# Patient Record
Sex: Male | Born: 1945 | Race: White | Hispanic: No | Marital: Married | Smoking: Never smoker
Health system: Southern US, Community
[De-identification: ages and names within clinical notes are randomized; demographics above are authoritative.]

## PROBLEM LIST (undated history)

## (undated) DIAGNOSIS — N400 Enlarged prostate without lower urinary tract symptoms: Secondary | ICD-10-CM

## (undated) DIAGNOSIS — C4492 Squamous cell carcinoma of skin, unspecified: Secondary | ICD-10-CM

## (undated) DIAGNOSIS — K5790 Diverticulosis of intestine, part unspecified, without perforation or abscess without bleeding: Secondary | ICD-10-CM

## (undated) DIAGNOSIS — D1803 Hemangioma of intra-abdominal structures: Secondary | ICD-10-CM

## (undated) DIAGNOSIS — C439 Malignant melanoma of skin, unspecified: Secondary | ICD-10-CM

## (undated) DIAGNOSIS — I2510 Atherosclerotic heart disease of native coronary artery without angina pectoris: Secondary | ICD-10-CM

## (undated) DIAGNOSIS — E785 Hyperlipidemia, unspecified: Secondary | ICD-10-CM

## (undated) DIAGNOSIS — K5090 Crohn's disease, unspecified, without complications: Secondary | ICD-10-CM

## (undated) DIAGNOSIS — A0472 Enterocolitis due to Clostridium difficile, not specified as recurrent: Secondary | ICD-10-CM

## (undated) DIAGNOSIS — D649 Anemia, unspecified: Secondary | ICD-10-CM

## (undated) DIAGNOSIS — I471 Supraventricular tachycardia: Secondary | ICD-10-CM

## (undated) DIAGNOSIS — I4719 Other supraventricular tachycardia: Secondary | ICD-10-CM

## (undated) DIAGNOSIS — K7689 Other specified diseases of liver: Secondary | ICD-10-CM

## (undated) DIAGNOSIS — M1990 Unspecified osteoarthritis, unspecified site: Secondary | ICD-10-CM

## (undated) DIAGNOSIS — R319 Hematuria, unspecified: Secondary | ICD-10-CM

## (undated) DIAGNOSIS — C4491 Basal cell carcinoma of skin, unspecified: Secondary | ICD-10-CM

## (undated) DIAGNOSIS — D696 Thrombocytopenia, unspecified: Secondary | ICD-10-CM

## (undated) HISTORY — DX: Unspecified osteoarthritis, unspecified site: M19.90

## (undated) HISTORY — PX: BACK SURGERY: SHX140

## (undated) HISTORY — DX: Hemangioma of intra-abdominal structures: D18.03

## (undated) HISTORY — PX: CHOLECYSTECTOMY: SHX55

## (undated) HISTORY — PX: SHOULDER ARTHROSCOPY: SHX128

## (undated) HISTORY — PX: CARDIAC CATHETERIZATION: SHX172

## (undated) HISTORY — DX: Benign prostatic hyperplasia without lower urinary tract symptoms: N40.0

## (undated) HISTORY — DX: Other specified diseases of liver: K76.89

## (undated) HISTORY — PX: TRANSURETHRAL RESECTION OF PROSTATE: SHX73

## (undated) HISTORY — DX: Diverticulosis of intestine, part unspecified, without perforation or abscess without bleeding: K57.90

## (undated) HISTORY — DX: Enterocolitis due to Clostridium difficile, not specified as recurrent: A04.72

## (undated) HISTORY — DX: Crohn's disease, unspecified, without complications: K50.90

## (undated) HISTORY — PX: ACHILLES TENDON SURGERY: SHX542

---
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
## 2002-02-27 ENCOUNTER — Encounter: Payer: Self-pay | Admitting: Neurosurgery

## 2002-02-27 ENCOUNTER — Encounter: Admission: RE | Admit: 2002-02-27 | Discharge: 2002-02-27 | Payer: Self-pay | Admitting: Neurosurgery

## 2002-03-20 ENCOUNTER — Encounter: Admission: RE | Admit: 2002-03-20 | Discharge: 2002-03-20 | Payer: Self-pay | Admitting: Neurosurgery

## 2002-03-20 ENCOUNTER — Encounter: Payer: Self-pay | Admitting: Neurosurgery

## 2002-11-10 ENCOUNTER — Encounter: Admission: RE | Admit: 2002-11-10 | Discharge: 2002-11-10 | Payer: Self-pay | Admitting: Neurosurgery

## 2002-11-10 ENCOUNTER — Encounter: Payer: Self-pay | Admitting: Neurosurgery

## 2002-11-24 ENCOUNTER — Encounter: Payer: Self-pay | Admitting: Neurosurgery

## 2002-11-24 ENCOUNTER — Encounter: Admission: RE | Admit: 2002-11-24 | Discharge: 2002-11-24 | Payer: Self-pay | Admitting: Neurosurgery

## 2002-12-16 ENCOUNTER — Encounter: Admission: RE | Admit: 2002-12-16 | Discharge: 2002-12-16 | Payer: Self-pay | Admitting: Neurosurgery

## 2002-12-16 ENCOUNTER — Encounter: Payer: Self-pay | Admitting: Neurosurgery

## 2006-08-24 ENCOUNTER — Ambulatory Visit (HOSPITAL_COMMUNITY): Admission: RE | Admit: 2006-08-24 | Discharge: 2006-08-25 | Payer: Self-pay | Admitting: Neurosurgery

## 2008-07-28 ENCOUNTER — Ambulatory Visit: Payer: Self-pay | Admitting: Internal Medicine

## 2008-08-11 ENCOUNTER — Ambulatory Visit: Payer: Self-pay

## 2008-08-11 ENCOUNTER — Encounter: Payer: Self-pay | Admitting: Internal Medicine

## 2008-08-26 ENCOUNTER — Ambulatory Visit (HOSPITAL_BASED_OUTPATIENT_CLINIC_OR_DEPARTMENT_OTHER): Admission: RE | Admit: 2008-08-26 | Discharge: 2008-08-26 | Payer: Self-pay | Admitting: Orthopedic Surgery

## 2008-09-02 ENCOUNTER — Ambulatory Visit: Payer: Self-pay | Admitting: Internal Medicine

## 2010-05-17 ENCOUNTER — Ambulatory Visit: Payer: Self-pay | Admitting: Cardiology

## 2011-01-31 NOTE — Op Note (Signed)
NAME:  Chase Barrett, Chase Barrett                 ACCOUNT NO.:  [id number]   MEDICAL RECORD NO.:  [id number]          PATIENT TYPE:  AMB   LOCATION:  [facility]                         FACILITY:  [facility]   PHYSICIAN:  Marlowe Kays, M.D.  DATE OF BIRTH:  December 25, 1945   DATE OF PROCEDURE:  08/26/2008  DATE OF DISCHARGE:                               OPERATIVE REPORT   PREOPERATIVE DIAGNOSES:  1. Chronic impingement syndrome with rotator cuff tendinopathy.  2. Labral degeneration.   POSTOPERATIVE DIAGNOSES:  1. Chronic impingement syndrome with rotator cuff tendinopathy.  2. Labral degeneration.   OPERATION:  1. Left shoulder arthroscopy with labral debridement.  2. Arthroscopic subacromial decompression.  3. Decompression with planing of the distal inferior clavicle.   SURGEON:  Marlowe Kays, M.D.   ASSISTANTDruscilla Brownie. Cherlynn June.   ANESTHESIA:  General.   PATHOLOGY AND INDICATIONS FOR PROCEDURE:  Because of painful right  shoulder I obtained an MRI of the shoulder demonstrating the  preoperative diagnosis.  The distal clavicle had impingement on the  rotator cuff.  The [facility] joint was narrow but it was not tender so I did  not feel that distal clavicle resection was warranted.   PROCEDURE IN DETAIL:  Under satisfactory general anesthesia, beach-chair  position on the sliding frame, right shoulder girdle was prepped with  DuraPrep and draped in sterile field.  Time-out performed and then the  anatomy of the shoulder was marked out including the [facility] joint, coracoid  process and placement for posterior and lateral portals.  I infiltrated  the two portal sites and subacromial space all with half percent  Marcaine with adrenaline and through a posterior soft spot portal  atraumatically entered the glenohumeral joint which basically was  unremarkable except for a fairly significant labral degeneration without  frank tearing.  On rotation of the arm the labral fibers could be seen  getting  entrapped between the humeral head and the glenoid at the level  of the biceps anchor.  Accordingly I advanced the scope between the  biceps tendon and subscapularis and using a switching stick made an  anterior incision.  Over the switching stick I placed a metal cannula  and then used a 4.2 shaver to enter the joint debriding down the labrum  with pre and post films being taken.  I then redirected the scope in the  subacromial space through the lateral portal with a little bit of  difficulty because of his significant downward curvature of the distal  acromion.  I was able to get into the subacromial space    DICTATION ENDS AT THIS POINT           ______________________________  Marlowe Kays, M.D.     JA/MEDQ  D:  08/26/2008  T:  08/26/2008  Job:  [id number]

## 2011-01-31 NOTE — Op Note (Signed)
NAME:  CAMDEN, KNOTEK NO.:  [id number]   MEDICAL RECORD NO.:  [id number]          PATIENT TYPE:  AMB   LOCATION:  [facility]                         FACILITY:  [facility]   PHYSICIAN:  Marlowe Kays, M.D.  DATE OF BIRTH:  [birth year]   DATE OF PROCEDURE:  DATE OF DISCHARGE:                               OPERATIVE REPORT   We had a power outage at the hospital and I guess that terminated the  dictation.   ADDENDUM   Through the lateral portal, with some difficulty because of the  significant curving down of the lateral acromion, I was able to place a  metal cannula followed by the 4.2 shaver.  The bursal tissue there I was  able to remove with the shaver and then followed this with the 90 degree  ArthroCare vaporizer removing soft tissue from the underneath the  surface of the acromion going back and identifying the [facility] joint and the  distal clavicle.  I then followed this with the 4-mm oval bur and began  burring down the distal clavicle and the underneath surface of the  distal acromion and going back-and-forth between the bur and the  vaporizer I was able to remove soft tissue and all of the compressing  bone.  This was documented with pictures with his arm to his side and  the arm abducted.  His rotator cuff was roughened, but I did not see any  frank tearing from either side of the cuff.  We then removed all fluid  possible and we re-infiltrated the posterior and lateral portals,  infiltrated the anterior portal and we infiltrated the subacromial space  well with the half percent Marcaine with adrenaline.  All these wounds  were closed with 4-0 nylon, Betadine and Adaptic pressure dressings and  shoulder immobilizer.  He tolerated the procedure well and was taken to  the recovery room in satisfactory condition with no known complications.           ______________________________  Marlowe Kays, M.D.     JA/MEDQ  D:  08/26/2008  T:  08/26/2008  Job:  [id number]

## 2011-01-31 NOTE — Letter (Signed)
July 28, 2008    Selinda Flavin  [address] [place], [place]  [place], [place] [address]   RE:  Chase, Barrett  MRN:  [id number]  /  DOB:  02/23/1946   Dear Dr. Dimas Aguas:   It was my pleasure to see your patient, Chase Barrett in EP consultation  today for further evaluation of palpitations and chest pain.  As you  recall, Chase Barrett is a pleasant 65 year old gentleman without  significant medical problems.  He reports that over the past year, he  has had insidious onset of palpitations.  He describes irregular heart  beats  lasting 3-4 seconds.  These episodes occur 2-3 times per month  and are typically observed at night.  The patient has not identified any  triggers or precipitance for these episodes and knows that they  spontaneously upbeat.  He denies associated chest pain, shortness of  breath, or other symptoms.  He also denies dizziness, presyncope, or  syncope.  He reports that approximately a week ago, he did have one  episode of left chest and arm discomfort, which he describes as  approximately 5 seconds of mild left chest achingwith radiation into  his arm associated with palpitations.  He denies any other exertional  symptoms.  He remains quite active and exercises daily without  difficulties.  He is otherwise without complaint today.   PAST MEDICAL HISTORY:  1. Degenerative disk disease status post multiple prior back      surgeries.  2. 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  3. Status post cholecystectomy in 2004.  4. Benign prostatic hypertrophy status post TURP in 2002.   ALLERGIES:  No known drug allergies.   MEDICATIONS:  Aspirin 81 mg daily.   SOCIAL HISTORY:  The patient lives [place], [place] with his  spouse.  He has 4 grown healthy children.  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  He denies tobacco or drug use.  He drinks 2-3 cocktails every 2-3  evenings.   FAMILY HISTORY:  Notable for coronary artery disease.  He denies a  family  history of arrhythmia or sudden cardiac death.   REVIEW OF SYSTEMS:  All systems are reviewed and negative except as  outlined in the HPI above.   PHYSICAL EXAMINATION:  VITAL SIGNS:  Blood pressure 121/72, heart rate  54, respirations 18, weight 172 pounds.  GENERAL:  The patient is a thin, healthy-appearing male in no acute  distress.  He is alert and oriented x3.  HEENT:  Normocephalic, atraumatic.  Sclerae clear.  Conjunctivae pink.  Oropharynx clear.  NECK:  Supple.  No JVD, lymphadenopathy, or bruits.  LUNGS:  Clear to auscultation bilaterally.  HEART:  Regular rate and rhythm.  No murmurs, rubs, or gallops.  GI:  Soft, nontender, nondistended.  Positive bowel sounds.  EXTREMITIES:  No clubbing, cyanosis, or edema.  SKIN:  No ecchymosis or lacerations.  MUSCULOSKELETAL:  No deformity or atrophy.  PSYCH:  Euthymic mood, full affect.   EKG; sinus bradycardia at 54 beats per minute otherwise normal EKG.  The  PR interval was 148 milliseconds with a QT interval of 440 milliseconds.   IMPRESSION:  Chase Barrett is a very pleasant 65 year old gentleman with no  significant past medical history who now presents for EP consultation  regarding symptomatic palpitations and atypical chest pain.  The  patient's history is most consistent with premature atrial contractions  or premature ventricular contractions, though I cannot exclude other  arrhythmias without electrocardiographic  documentation.  I think that we  should obtain an echocardiogram to evaluate for structural heart  disease.  In the absence of structural heart disease, a life threatening  arrhythmia would be highly unlikely.  I will also obtain a graded  exercise test Myoview to evaluate for exertional arrhythmias or evidence  of ischemia.  As the patient's episodes are quite infrequent, I do not  think that an event monitor would be helpful at the present time that we  could consider this down the road.  I have not made any  medication  changes today.  I have asked Chase Barrett to follow up in my office in 6  weeks after his echo and stress test have been performed to further  discuss arrhythmia management.   Thank you for the opportunity of participating in the care of Chase Barrett.  Please feel free to contact me should any questions arise.    Sincerely,      Hillis Range, MD  Electronically Signed    JA/MedQ  DD: 07/28/2008  DT: 07/29/2008  Job #: [id number]

## 2011-01-31 NOTE — Letter (Signed)
September 02, 2008    Chase Chase Barrett  [address] [place], [place]  [place], [place] [address]   RE:  Chase, Chase Barrett  MRN:  [id number]  /  DOB:  [birth year]   Dear Dr. Dimas Aguas:   It was my pleasure to see your patient, Chase Chase Barrett in clinic today for  followup of his palpitations and chest discomfort.  As you recall, Mr.  Chase Barrett is a very pleasant 65 year old gentleman without significant  medical problems.  Upon last being evaluated in my clinic, he described  symptoms of irregular heart beats lasting several seconds as well as a  single episode of left arm discomfort and chest discomfort lasting only  several seconds.  Since that time, he has done quite well.  He denies  any significant palpitations.  He denies chest pain, shortness of  breath, orthopnea, PND, presyncope, or syncope.  He underwent shoulder  surgery several days ago without event.   PROBLEM LIST:  1. Palpitations likely due to premature atrial contractions or      premature ventricular contractions.  2. Degenerative joint disease.  3. Degenerative disk disease.  4. Benign prostatic hypertrophy.   ALLERGIES:  No known drug allergies.   MEDICATIONS:  Aspirin 81 mg daily.   PHYSICAL EXAMINATION:  VITAL SIGNS:  Blood pressure 128/78, heart rate  75, respirations 18, and weight 170 pounds.  GENERAL:  The patient is a thin male in no acute distress.  He is alert  and oriented x3.  HEENT:  Normocephalic, atraumatic.  Sclerae clear.  Conjunctivae pink.  Oropharynx clear.  NECK:  Supple.  No JVD, lymphadenopathy, or bruits.  LUNGS:  Clear to auscultation bilaterally.  HEART:  Regular rate and rhythm.  No murmurs, rubs, or gallops.  GI:  Soft, nontender, and nondistended.  Positive bowel sounds.  EXTREMITIES:  No clubbing, cyanosis, or edema.  NEUROLOGIC:  Nonfocal.  SKIN:  No ecchymosis or lacerations.   Transthoracic echocardiogram performed on August 11, 2008, revealed a  left ventricular ejection fraction of 55% with mild  mitral regurgitation  and mild left atrial dilatation.   GXT Myoview performed on August 11, 2008, revealed that the patient  exercised for a duration of 11 minutes and 16 seconds, reaching a heart  rate of 142 (90% of maximum heart rate) with upsloping ST depression  only.  He stopped study due to fatigue and did not have chest  discomfort.  He had a normal blood pressure response to exercise.  His  Cardiolite stress and rest images were normal with a left ventricular  ejection fraction of 52%.   IMPRESSION:  Chase Chase Barrett is a very pleasant 65 year old gentleman with  symptomatic palpitations, most likely due to premature atrial  contractions or premature ventricular contractions as well as a single  episode of atypical chest pain, who now presents for followup.  His GXT  Myoview reveals good exercise tolerance with low risk for ischemia.  His  echocardiogram reveals no significant structural heart disease.  I  therefore think that the patient's symptoms of palpitations are benign.  I do not think that further clinical evaluation is warranted at this  time.  I will therefore return the entirety of Chase Chase Barrett care to you.  I think that he should continue his regular exercise routine as well as  healthy lifestyle.  I would also continue aspirin 81 mg daily in long-  term.   PLAN:  1. Aspirin 81 mg daily.  2. Follow up in my office p.r.n.  Thank you for the opportunity of participating in the care of Chase Chase Barrett.  Please feel free to contact me if you would like to discuss his care  further.    Sincerely,      Hillis Range, MD  Electronically Signed    JA/MedQ  DD: 09/02/2008  DT: 09/03/2008  Job #: [id number]

## 2011-02-03 NOTE — H&P (Signed)
NAME:  Chase Barrett, Chase Barrett NO.:  [id number]   MEDICAL RECORD NO.:  [id number]          PATIENT TYPE:  OIB   LOCATION:  3003                         FACILITY:  [facility]   PHYSICIAN:  Hilda Lias, M.D.   DATE OF BIRTH:  07-Feb-1946   DATE OF ADMISSION:  08/24/2006  DATE OF DISCHARGE:  08/25/2006                              HISTORY & PHYSICAL   HISTORY OF PRESENT ILLNESS:  [name] is a gentleman who came to see me  complaining of back pain with radiation down to the left leg, associated  with discomfort and pain.  The patient denies any pain whatsoever in the  right leg.  This gentleman many years ago underwent degenerative disk  disease surgery with L4-L5 and L5-S1 foraminotomy and diskectomy.  I  have been following him on several occasions.  He had postoperative  treatment and now he is getting worse.  He had an MRI scan that shows  degenerative disk disease at the level of 4-5, 5-1; but there is a  fragment below 3-4 on the left side.   At the beginning, we were planning to go ahead with fusion, but now with  his pain being most localized compromising the L3 and L4 nerve roots, we  are going to proceed with a diskectomy at this level and taking care of  the chronic back pain later on.   PAST MEDICAL HISTORY:  Lumbar diskectomy x two.  He does not smoke.  He  drinks socially.  His family history is unremarkable.   REVIEW OF SYSTEMS:  Review of systems is positive for decreased hearing  and back pain.   PHYSICAL EXAMINATION:  Head, Eyes, Ears, Nose & Throat:  Normal.  Neck:  Normal.  Lungs:  Clear.  Heart:  Heart sounds are normal.  No murmur.  Extremities:  Normal pulses.  Neurologic:  Mental status is normal.  He  has weakness of the left quadriceps.  He has a burning sensation  in the  left L3-L4 dermatome in the right side.  He has a decrease of the  flexibility of the lumbar spine.   RADIOLOGIC FINDINGS:  The MRI scan shows degenerative disk disease at  L4-  L5 and L5-S1, but he has a free fragment at the level of L3-L4.  The  left L3 nerve root is affected.   CLINICAL IMPRESSION:  1. Left L3-L4 herniated disk with a fragment.  2. Degenerative disk disease, L4, L5, S1.   RECOMMENDATIONS:  The patient is being admitted for surgery.  The  procedure will be an L3-L4 diskectomy.  He knows about the risks,  especially CSF leak,  no improvement whatsoever, no improvement of the  lower back pain, need for further surgery, damage to vessels in the  abdomen, and paralysis.           ______________________________  Hilda Lias, M.D.     EB/MEDQ  D:  08/24/2006  T:  08/25/2006  Job:  [id number]

## 2011-02-03 NOTE — Op Note (Signed)
NAME:  Chase Barrett, Chase Barrett                 ACCOUNT NO.:  [id number]   MEDICAL RECORD NO.:  [id number]          PATIENT TYPE:  AMB   LOCATION:  SDS                          FACILITY:  [facility]   PHYSICIAN:  Hilda Lias, M.D.   DATE OF BIRTH:  05-Nov-1945   DATE OF PROCEDURE:  08/24/2006  DATE OF DISCHARGE:                               OPERATIVE REPORT   PREOPERATIVE DIAGNOSES:  Left L3-4 herniated disk with a fragment into  the body of L4; degenerative disk disease, 4-5 and 5-1; status post  lumbar diskectomy.   POSTOPERATIVE DIAGNOSES:  Left L3-4 herniated disk with a fragment into  the body of L4; degenerative disk disease, 4-5 and 5-1; status post  lumbar diskectomy.   PROCEDURES:  Left L3-L4 diskectomy, decompression of the L3-L4 nerve  roots, removal of calcified disk, removal of loose facet of L3;  microscopy.   SURGEON:  Hilda Lias, M.D.   CLINICAL HISTORY:  Mr. Kassis is a gentleman, who had 2 back surgeries in  the past.  I have been from him from the mid-80s.  Lately, he had been  complaining of back pain with radiation to the left leg.  Although he  has back pain, most of the pain was in the left leg.  He has failed  conservative treatment, including epidural injections and medications.  X-ray and MRI showed that he has degenerative disk disease at the levels  of 4-5 and 5-1, but there was a fragment at the level of 3-4 on the  left, which went along with the diagnosis.  Surgery was advised.  The  patient knows that in the future he might require lumbar fusion.  As a  matter of fact, that was the plan before we found the fragment at the  level of 3-4 on the left side.   DESCRIPTION OF PROCEDURES:  The patient was taken to the OR, and he was  positioned in a prone manner.  The back was cleaned with DuraPrep.  X-  rays showed that we were at the level of L3.  From then on, we went and  opened the incision in the skin from 3 to 4 on the left side.  Muscles  were retracted  laterally.  With the microscope, we drilled the lower  lamina of L3 and the upper of L4.  A thick calcified ligament was also  removed.  Immediately what we found was that the inferior facet of L3  was loose.  Because of that, there was no way to preserve it, so we went  ahead and removed it.  Then, with the 2 and 3-mm Kerrison punches, we  did the foraminotomy.  We retracted the thecal sac, and, indeed, there  was a large calcified disk in the midline, affecting not only the L3,  but also the L4 nerve roots.  Incision was made.  Fragments were removed  from the body of L4.  Then, we entered the disk space at 3-4 and total  diskectomy was achieved with the fragments and removed laterally.  At  the end, we had a good decompression.  From then on, we went to the  lateral aspect of the facet joint, and using the same facet bone, we  drilled the area and we put some bone for autograft localized fusion.  The area was irrigated.  Fentanyl was left in the epidural space and the  wound was closed with Vicryl and Steri-Strips.           ______________________________  Hilda Lias, M.D.     EB/MEDQ  D:  08/24/2006  T:  08/25/2006  Job:  [id number]

## 2011-06-23 LAB — POCT HEMOGLOBIN-HEMACUE: Hemoglobin: 15.3 g/dL (ref 13.0–17.0)

## 2011-07-05 ENCOUNTER — Encounter: Payer: Self-pay | Admitting: Internal Medicine

## 2011-07-28 ENCOUNTER — Encounter: Payer: Self-pay | Admitting: Internal Medicine

## 2011-08-01 ENCOUNTER — Encounter: Payer: Self-pay | Admitting: Internal Medicine

## 2011-08-01 ENCOUNTER — Ambulatory Visit (INDEPENDENT_AMBULATORY_CARE_PROVIDER_SITE_OTHER): Payer: Managed Care, Other (non HMO) | Admitting: Internal Medicine

## 2011-08-01 VITALS — BP 126/82 | HR 72 | Ht 70.0 in | Wt 170.0 lb

## 2011-08-01 DIAGNOSIS — R933 Abnormal findings on diagnostic imaging of other parts of digestive tract: Secondary | ICD-10-CM

## 2011-08-01 NOTE — Patient Instructions (Addendum)
CC: Dr Selinda Flavin, [facility], Patient, Dr Mena Goes

## 2011-08-01 NOTE — Progress Notes (Signed)
Chase Barrett 1946-03-04 MRN [id number]   History of Present Illness:  This is a 65 year old white male who here for evaluation of an abnormal CT scan of the abdomen and pelvis which was done for microscopic hematuria. The CT scan noted a segment of the small intestine which was thickened. There was no obstruction and there were no signs of Crohn's disease. Patient is asymptomatic. He has 3-4 bowel movements a day which are formed and there  has been no blood. He denies cramps, abdominal pain or any weight changes. There is no family history of inflammatory bowel disease. He takes Aleve 2-4 times a day for back problems. His last colonoscopy in July 2003 showed internal hemorrhoids and mild diverticulosis of the left colon. There is a history of cholecystectomy for cholelithiasis.   Past Medical History  Diagnosis Date  . BPH (benign prostatic hyperplasia)   . Arthritis   . Diverticulosis   . Hepatic cyst   . Hepatic hemangioma   . Hemorrhoids   . DJD (degenerative joint disease)    Past Surgical History  Procedure Date  . Back surgery     x5  . Cholecystectomy   . Shoulder arthroscopy   . Transurethral resection of prostate     reports that he has never smoked. He does not have any smokeless tobacco history on file. He reports that he drinks alcohol. He reports that he does not use illicit drugs. family history includes Lung cancer in his mother; Prostate cancer in his father; and Stroke in his sister. No Known Allergies      Review of Systems: Denies upper GI symptoms of heartburn dysphagia or shortness of breath or chest pain  The remainder of the 10 point ROS is negative except as outlined in H&P   Physical Exam: General appearance  Well developed, in no distress. Eyes- non icteric. HEENT nontraumatic, normocephalic. Mouth no lesions, tongue papillated, no cheilosis. Neck supple without adenopathy, thyroid not enlarged, no carotid bruits, no JVD. Lungs Clear to  auscultation bilaterally. Cor normal S1, normal S2, regular rhythm, no murmur,  quiet precordium. Abdomen: Soft abdomen with normoactive bowel sounds. No tenderness. No distention. No bruit. No fullness. Rectal: Normal perianal area. Normal rectal sphincter tone. Small amount of soft Hemoccult negative stool. Soft regular prostate. Extremities no pedal edema. Skin no lesions. Neurological alert and oriented x 3. Psychological normal mood and affect.  Assessment and Plan:  Problem #1 abnormal CT scan of the abdomen. There is no clinical correlation with the abnormality on the CT scan. The thickening of the small bowel is nonspecific and may reflect peristalsis. There are no symptoms which would suggest Crohn's disease, ischemic bowel or ileitis. Anti-inflammatory agents can cause ulcerations in the small bowel and he takes Aleve. I asked him to cut back on his Aleve at this time.  Problem #2 Colorectal screening. Patient's last colonoscopy was in July 2003. A recall colonoscopy will be due in July 2013 at which time he will receive a recall letter.   08/01/2011 Lina Sar

## 2011-12-29 ENCOUNTER — Encounter: Payer: Self-pay | Admitting: Internal Medicine

## 2012-01-24 DIAGNOSIS — C4492 Squamous cell carcinoma of skin, unspecified: Secondary | ICD-10-CM

## 2012-01-24 HISTORY — DX: Squamous cell carcinoma of skin, unspecified: C44.92

## 2012-08-21 ENCOUNTER — Encounter (HOSPITAL_COMMUNITY): Payer: Self-pay | Admitting: *Deleted

## 2012-08-21 ENCOUNTER — Emergency Department (HOSPITAL_COMMUNITY): Payer: Managed Care, Other (non HMO)

## 2012-08-21 ENCOUNTER — Inpatient Hospital Stay (HOSPITAL_COMMUNITY)
Admission: EM | Admit: 2012-08-21 | Discharge: 2012-08-24 | DRG: 247 | Disposition: A | Discharge status: Home / Self Care | Payer: Managed Care, Other (non HMO) | Source: Ambulatory Visit | Attending: Cardiovascular Disease | Admitting: Cardiovascular Disease

## 2012-08-21 DIAGNOSIS — M1990 Unspecified osteoarthritis, unspecified site: Secondary | ICD-10-CM | POA: Diagnosis present

## 2012-08-21 DIAGNOSIS — Z8249 Family history of ischemic heart disease and other diseases of the circulatory system: Secondary | ICD-10-CM

## 2012-08-21 DIAGNOSIS — K5730 Diverticulosis of large intestine without perforation or abscess without bleeding: Secondary | ICD-10-CM | POA: Diagnosis present

## 2012-08-21 DIAGNOSIS — I4891 Unspecified atrial fibrillation: Secondary | ICD-10-CM | POA: Diagnosis present

## 2012-08-21 DIAGNOSIS — D1803 Hemangioma of intra-abdominal structures: Secondary | ICD-10-CM | POA: Diagnosis present

## 2012-08-21 DIAGNOSIS — Z87438 Personal history of other diseases of male genital organs: Secondary | ICD-10-CM

## 2012-08-21 DIAGNOSIS — D696 Thrombocytopenia, unspecified: Secondary | ICD-10-CM

## 2012-08-21 DIAGNOSIS — I2510 Atherosclerotic heart disease of native coronary artery without angina pectoris: Principal | ICD-10-CM | POA: Diagnosis present

## 2012-08-21 DIAGNOSIS — N400 Enlarged prostate without lower urinary tract symptoms: Secondary | ICD-10-CM | POA: Diagnosis present

## 2012-08-21 DIAGNOSIS — Z23 Encounter for immunization: Secondary | ICD-10-CM

## 2012-08-21 DIAGNOSIS — Z9089 Acquired absence of other organs: Secondary | ICD-10-CM

## 2012-08-21 DIAGNOSIS — K7689 Other specified diseases of liver: Secondary | ICD-10-CM | POA: Diagnosis present

## 2012-08-21 DIAGNOSIS — Z79899 Other long term (current) drug therapy: Secondary | ICD-10-CM

## 2012-08-21 DIAGNOSIS — E785 Hyperlipidemia, unspecified: Secondary | ICD-10-CM | POA: Diagnosis present

## 2012-08-21 DIAGNOSIS — D649 Anemia, unspecified: Secondary | ICD-10-CM

## 2012-08-21 DIAGNOSIS — R079 Chest pain, unspecified: Secondary | ICD-10-CM

## 2012-08-21 DIAGNOSIS — I200 Unstable angina: Secondary | ICD-10-CM

## 2012-08-21 HISTORY — DX: Hematuria, unspecified: R31.9

## 2012-08-21 HISTORY — DX: Other supraventricular tachycardia: I47.19

## 2012-08-21 HISTORY — DX: Supraventricular tachycardia: I47.1

## 2012-08-21 HISTORY — DX: Atherosclerotic heart disease of native coronary artery without angina pectoris: I25.10

## 2012-08-21 HISTORY — DX: Hyperlipidemia, unspecified: E78.5

## 2012-08-21 HISTORY — DX: Thrombocytopenia, unspecified: D69.6

## 2012-08-21 HISTORY — DX: Anemia, unspecified: D64.9

## 2012-08-21 LAB — CBC
HCT: 36.2 % — ABNORMAL LOW (ref 39.0–52.0)
Hemoglobin: 12.1 g/dL — ABNORMAL LOW (ref 13.0–17.0)
MCH: 28.9 pg (ref 26.0–34.0)
RBC: 4.19 MIL/uL — ABNORMAL LOW (ref 4.22–5.81)

## 2012-08-21 LAB — COMPREHENSIVE METABOLIC PANEL
ALT: 16 U/L (ref 0–53)
Alkaline Phosphatase: 85 U/L (ref 39–117)
CO2: 29 mEq/L (ref 19–32)
GFR calc Af Amer: >90 mL/min (ref >90)
GFR calc non Af Amer: 89 mL/min — ABNORMAL LOW (ref >90)
Glucose, Bld: 88 mg/dL (ref 70–99)
Potassium: 4.6 mEq/L (ref 3.5–5.1)
Sodium: 141 mEq/L (ref 135–145)

## 2012-08-21 LAB — TROPONIN I
Troponin I: <0.3 ng/mL (ref <0.30)
Troponin I: <0.3 ng/mL (ref <0.30)

## 2012-08-21 LAB — APTT: aPTT: 30 seconds (ref 24–37)

## 2012-08-21 LAB — HEMOGLOBIN A1C
Hgb A1c MFr Bld: 5.3 % (ref <5.7)
Mean Plasma Glucose: 105 mg/dL (ref <117)

## 2012-08-21 MED ORDER — NITROGLYCERIN 0.4 MG SL SUBL
0.4000 mg | SUBLINGUAL_TABLET | SUBLINGUAL | Status: DC | PRN
  Administered 2012-08-21: 0.4 mg via SUBLINGUAL

## 2012-08-21 MED ORDER — ATORVASTATIN CALCIUM 40 MG PO TABS
40.0000 mg | ORAL_TABLET | Freq: Every day | ORAL | Status: DC
  Administered 2012-08-21 – 2012-08-23 (×3): 40 mg via ORAL
  Filled 2012-08-21 (×5): qty 1

## 2012-08-21 MED ORDER — SODIUM CHLORIDE 0.9 % IV SOLN: 250.0000 mL | INTRAVENOUS | Status: DC | PRN

## 2012-08-21 MED ORDER — ASPIRIN EC 81 MG PO TBEC
81.0000 mg | DELAYED_RELEASE_TABLET | Freq: Every day | ORAL | Status: DC
  Administered 2012-08-23: 81 mg via ORAL
  Filled 2012-08-21 (×2): qty 1

## 2012-08-21 MED ORDER — SODIUM CHLORIDE 0.9 % IJ SOLN: 3.0000 mL | INTRAMUSCULAR | Status: DC | PRN

## 2012-08-21 MED ORDER — ONDANSETRON HCL 4 MG/2ML IJ SOLN
4.0000 mg | Freq: Four times a day (QID) | INTRAMUSCULAR | Status: DC | PRN
  Filled 2012-08-21: qty 2

## 2012-08-21 MED ORDER — PNEUMOCOCCAL VAC POLYVALENT 25 MCG/0.5ML IJ INJ
0.5000 mL | INJECTION | INTRAMUSCULAR | Status: AC
  Filled 2012-08-21: qty 0.5

## 2012-08-21 MED ORDER — ALFUZOSIN HCL ER 10 MG PO TB24
10.0000 mg | ORAL_TABLET | Freq: Every day | ORAL | Status: DC
  Administered 2012-08-23 – 2012-08-24 (×2): 10 mg via ORAL
  Filled 2012-08-21 (×5): qty 1

## 2012-08-21 MED ORDER — ASPIRIN 81 MG PO CHEW
324.0000 mg | CHEWABLE_TABLET | ORAL | Status: AC
  Administered 2012-08-22: 324 mg via ORAL
  Filled 2012-08-21: qty 4

## 2012-08-21 MED ORDER — SODIUM CHLORIDE 0.9 % IV SOLN
INTRAVENOUS | Status: DC
  Administered 2012-08-21: 21:00:00 via INTRAVENOUS

## 2012-08-21 MED ORDER — ACETAMINOPHEN 325 MG PO TABS: 650.0000 mg | ORAL_TABLET | ORAL | Status: DC | PRN

## 2012-08-21 MED ORDER — INFLUENZA VIRUS VACC SPLIT PF IM SUSP
0.5000 mL | INTRAMUSCULAR | Status: AC
  Filled 2012-08-21: qty 0.5

## 2012-08-21 MED ORDER — ASPIRIN 81 MG PO CHEW: 243.0000 mg | CHEWABLE_TABLET | Freq: Once | ORAL | Status: DC

## 2012-08-21 MED ORDER — DIAZEPAM 5 MG PO TABS
5.0000 mg | ORAL_TABLET | ORAL | Status: AC
  Administered 2012-08-22: 5 mg via ORAL
  Filled 2012-08-21: qty 1

## 2012-08-21 MED ORDER — SODIUM CHLORIDE 0.9 % IJ SOLN
3.0000 mL | Freq: Two times a day (BID) | INTRAMUSCULAR | Status: DC
  Administered 2012-08-21: 3 mL via INTRAVENOUS

## 2012-08-21 MED ORDER — ALPRAZOLAM 0.25 MG PO TABS: 0.2500 mg | ORAL_TABLET | Freq: Two times a day (BID) | ORAL | Status: DC | PRN

## 2012-08-21 NOTE — ED Provider Notes (Signed)
History     CSN: [id number]  Arrival date & time 08/21/12  1024   First MD Initiated Contact with Patient 08/21/12 1033      Chief Complaint  Patient presents with  . Chest Pain    painfree currently    (Consider location/radiation/quality/duration/timing/severity/associated sxs/prior treatment) HPI Comments: Mr. Schuneman presents via EMS for evaluation of chest discomfort.  He states 1 week ago after swimming about 600 yards he experienced vague discomfort that resolved after resting.  2 days later after walking 1/4 mile up a steep hill he experienced similar discomfort that resolved after rest.  2 days ago wile swimming, this time less than half the original distance, he had similar pain that was more intense and lingered longer.  He has hadd several vague episodes  Also.  Today, after walking up the stairs at work, he had anther episode that resolved after resting bu again returned while just sitting.  A coworker noticed he wasn't feeling well and after he explained the story, EMS was called.  He received aspirin and ntg en route to the ER.  He reports being extremely active and denies alcohol, drug, or tobacco abuse.  Patient is a 66 y.o. male presenting with chest pain. The history is provided by the patient. No language interpreter was used.  Chest Pain The chest pain began 5 - 7 days ago. Chest pain occurs intermittently. The chest pain is worsening. The pain is associated with exertion. At its most intense, the pain is at 7/10. The pain is currently at 0/10. The quality of the pain is described as aching, dull and pressure-like. The pain radiates to the left neck. Pertinent negatives for primary symptoms include no fever, no fatigue, no syncope, no shortness of breath, no cough, no wheezing, no palpitations, no abdominal pain, no nausea, no vomiting, no dizziness and no altered mental status.  Pertinent negatives for associated symptoms include no claudication, no diaphoresis, no lower  extremity edema, no near-syncope, no numbness, no orthopnea and no weakness. He tried aspirin and nitroglycerin (asa and ntg given by EMS) for the symptoms. Risk factors include male gender.     Past Medical History  Diagnosis Date  . BPH (benign prostatic hyperplasia)   . Arthritis   . Diverticulosis   . Hepatic cyst   . Hepatic hemangioma   . Hemorrhoids   . DJD (degenerative joint disease)     Past Surgical History  Procedure Date  . Back surgery     x5  . Cholecystectomy   . Shoulder arthroscopy   . Transurethral resection of prostate     Family History  Problem Relation Age of Onset  . Lung cancer Mother   . Prostate cancer Father   . Stroke Sister     History  Substance Use Topics  . Smoking status: Never Smoker   . Smokeless tobacco: Not on file  . Alcohol Use: Yes     Comment: 2 cocktails 2-3 times per week      Review of Systems  Constitutional: Negative for fever, diaphoresis and fatigue.  Respiratory: Negative for cough, shortness of breath and wheezing.   Cardiovascular: Positive for chest pain. Negative for palpitations, orthopnea, claudication, syncope and near-syncope.  Gastrointestinal: Negative for nausea, vomiting and abdominal pain.  Neurological: Negative for dizziness, weakness and numbness.  Psychiatric/Behavioral: Negative for altered mental status.  All other systems reviewed and are negative.    Allergies  Review of patient's allergies indicates no known allergies.  Home Medications  Current Outpatient Rx  Name  Route  Sig  Dispense  Refill  . ALFUZOSIN HCL ER 10 MG PO TB24   Oral   Take 10 mg by mouth daily.           . ADULT MULTIVITAMIN W/MINERALS CH   Oral   Take 1 tablet by mouth daily.         Marland Kitchen NAPROXEN SODIUM 220 MG PO TABS   Oral   Take 440 mg by mouth daily as needed. For back pain         . TADALAFIL 20 MG PO TABS   Oral   Take 20 mg by mouth daily as needed.             BP 138/78  Pulse 59   Temp 97.1 F (36.2 C) (Oral)  Resp 16  SpO2 100%  Physical Exam  Nursing note and vitals reviewed. Constitutional: He is oriented to person, place, and time. He appears well-developed. No distress.  HENT:  Head: Normocephalic and atraumatic.  Right Ear: External ear normal.  Left Ear: External ear normal.  Nose: Nose normal.  Mouth/Throat: Oropharynx is clear and moist. No oropharyngeal exudate.  Eyes: Conjunctivae normal are normal. Pupils are equal, round, and reactive to light. Right eye exhibits no discharge. Left eye exhibits no discharge. No scleral icterus.  Neck: Normal range of motion. Neck supple. No JVD present. No tracheal deviation present.  Cardiovascular: Regular rhythm, S1 normal, S2 normal, normal heart sounds, intact distal pulses and normal pulses.   No extrasystoles are present. Bradycardia present.  PMI is not displaced.  Exam reveals no gallop, no distant heart sounds, no friction rub and no decreased pulses.   No murmur heard. Pulmonary/Chest: Breath sounds normal. No stridor. No respiratory distress. He has no wheezes. He has no rales. He exhibits no tenderness.  Abdominal: Soft. Bowel sounds are normal. He exhibits no distension and no mass. There is no tenderness. There is no rebound and no guarding.  Musculoskeletal: Normal range of motion. He exhibits no edema and no tenderness.  Lymphadenopathy:    He has no cervical adenopathy.  Neurological: He is alert and oriented to person, place, and time. No cranial nerve deficit.  Skin: Skin is warm and dry. No rash noted. He is not diaphoretic. No erythema. No pallor.  Psychiatric: He has a normal mood and affect. His behavior is normal.    ED Course  Procedures (including critical care time)   Labs Reviewed  CBC  COMPREHENSIVE METABOLIC PANEL  TROPONIN I  CK TOTAL AND CKMB  PROTIME-INR  APTT   No results found.   No diagnosis found.   Date: 08/21/2012  Rate: 50 bpm  Rhythm: sinus bradycardia  QRS  Axis: normal  Intervals: normal  ST/T Wave abnormalities: normal  Conduction Disutrbances:none  Narrative Interpretation:   Old EKG Reviewed: unchanged      MDM  Pt presents for evaluation of chest pain.  He is currently pain free, note stable VS, NAD.  He describes a hx of exertional discomfort over the last week that has increased in frequency, persistence, and diminished in the amount of exertion it requires to cause the pain.  Today he had pain at rest.  Note no evidence of acute ischemia on his EKG.  Will obtain basic labs, cardiac markers, and CXR.  Will consult the on-call cardiology group for evaluation as the results become available.  Secondary to a concerning history, I will recommend admission regardless of  the results of the labs.  1230.  Pt is pain free, NAD.  Note negative CXR, basic labs, and cardiac markers.  Contacted the on-call provide for the [facility].  He will be evaluated at the bedside.  Anticipate admission for further testing.      Tobin Chad, MD 08/21/12 1245

## 2012-08-21 NOTE — ED Notes (Signed)
Pt awaiting cardiology consult for admit status.

## 2012-08-21 NOTE — Progress Notes (Signed)
Walked into room, pt c/o 8/10, right sided stabbing pain, going thru to the back; pt also c/o of some nausea; pts VS 162/84, NSR; pt placed on 2L-[place], sats 100%; EKG obtained, no cahnges noted, 1 SL NTG given and pain is gone; Alinda Money, cards PA, paged and made aware; will continue to monitor

## 2012-08-21 NOTE — ED Notes (Signed)
Pt here per [facility] with c/o episodes of CP with activity.  CP usually resolves when pt rests.  Period of CP started last night that lasted longer than usual with associated shortness of breath.  CP resolved with rest.  Pt advises he had a period CP last night when getting up during the night.  Period of CP this am that he called 911 for.  [facility] placed IV and gave pt 1 NTG SL with relief of CP.  Pt states he also took a regular ASA while awaiting EMS.

## 2012-08-21 NOTE — H&P (Signed)
History and Physical   Patient ID: Chase Barrett MRN: [id number], DOB/AGE: 11-18-1945   Admit date: 08/21/2012 Date of Consult: 08/21/2012   Primary Physician: Selinda Flavin, MD Primary Cardiologist: New to cardiology   HPI: Chase Barrett is a 66yo male with no prior cardiac history, PMHx s/f DJD, OA and BPH (s/p TURP) and history of hematuria who presents to [facility] ED with progressively worsening exertional chest pain.   Underwent Abd/pelv CT in 06/2011 for hematuria revealed no acute etiology. Hemangioma detected. He reports a prior history of urosepsis sourced from prostatitis. He reports undergoing a nuclear stress test after recovering given elevated BP and HR during that time. This was normal. He underwent ETT 3 years ago for a routine employment physical which was normal.   He is fairly active and eats healthily. He swims, walks and bikes frequently. Last Wednesday, he reports going for a swim with a goal to complete 600 yds. Mid-way through, he developed dull, substernal chest tightness radiating to his left arm. He rested, the pain alleviated, and he completed the swim. A few days later, he noticed the same chest discomfort walking up an incline, relieving with rest and walking on flat ground and occurring once more up an incline. These episodes occurred at shorter activity levels and higher intensity, then eventually at rest around 2 AM and at work this morning. He reports associated shortness of breath, lightheadedness and diaphoresis. No active bleeding. No association with inspiration, palpation, meals or position changes. No unilateral leg pain or weakness. Does report occasional RLE swelling attributed to lymphedema improved with compression stockings/elevation. Clearly distinct from muscle soreness, arthritic pain. He informed his coworkers of his pain this morning and EMS was called. NTG SL x 1 was given with relief. He was given a full-dose ASA and transported to [facility] ED. No active  bleeding, fevers, chills or cough. No hx of HTN, HLD, DM, tobacco abuse. Father with CAD, no prior MIs, alive at 35.    In the ED, EKG is w/o ischemic changes. Cardiac biomarkers WNL x 1. CXR unremarkable. BMET unremarkable. CBC with mild normocytic anemia and mild thrombocytopenia.   Problem List: Past Medical History  Diagnosis Date  . BPH (benign prostatic hyperplasia)   . Arthritis   . Diverticulosis   . Hepatic cyst   . Hepatic hemangioma   . Hemorrhoids   . DJD (degenerative joint disease)     Past Surgical History  Procedure Date  . Back surgery     x5  . Cholecystectomy   . Shoulder arthroscopy   . Transurethral resection of prostate      Allergies: No Known Allergies  Home Medications: Prior to Admission medications   Medication Sig Start Date End Date Taking? Authorizing Provider  alfuzosin (UROXATRAL) 10 MG 24 hr tablet Take 10 mg by mouth daily.     Yes Historical Provider, MD  Multiple Vitamin (MULTIVITAMIN WITH MINERALS) TABS Take 1 tablet by mouth daily.   Yes Historical Provider, MD  naproxen sodium (ANAPROX) 220 MG tablet Take 440 mg by mouth daily as needed. For back pain   Yes Historical Provider, MD  tadalafil (CIALIS) 20 MG tablet Take 20 mg by mouth daily as needed.     Yes Historical Provider, MD    Inpatient Medications:     . [DISCONTINUED] aspirin  243 mg Oral Once    (Not in a hospital admission)  Family History  Problem Relation Age of Onset  . Lung cancer Mother   .  Prostate cancer Father   . Stroke Sister      History   Social History  . Marital Status: Married    Spouse Name: N/A    Number of Children: 4  . Years of Education: N/A   Occupational History  .  [employer]   Social History Main Topics  . Smoking status: Never Smoker   . Smokeless tobacco: Not on file  . Alcohol Use: Yes     Comment: 2 cocktails 2-3 times per week  . Drug Use: No  . Sexually Active: Not on file   Other Topics Concern  . Not on file    Social History Narrative  . No narrative on file     Review of Systems: General: positive for diaphoresis, negative for chills, fever, night sweats or weight changes.  Cardiovascular: positive for chest pain, shortness of breath, negative for dyspnea on exertion, edema, orthopnea, palpitations, paroxysmal nocturnal dyspnea Dermatological: negative for rash Respiratory: negative for cough or wheezing Urologic: negative for hematuria Abdominal: negative for nausea, vomiting, diarrhea, bright red blood per rectum, melena, or hematemesis Neurologic: positive for lightheadedness, negative for visual changes, syncope All other systems reviewed and are otherwise negative except as noted above.  Physical Exam: Blood pressure 140/72, pulse 50, temperature 97.1 F (36.2 C), temperature source Oral, resp. rate 18, SpO2 98.00%.    General: Well developed, well nourished, in no acute distress. Head: Normocephalic, atraumatic, sclera non-icteric, no xanthomas, nares are without discharge.  Neck: Negative for carotid bruits. JVD not elevated. Lungs:  Clear bilaterally to auscultation without wheezes, rales, or rhonchi. Breathing is unlabored. Heart: RRR with S1 S2. No murmurs, rubs, or gallops appreciated. Abdomen:  Soft, non-tender, non-distended with normoactive bowel sounds. No hepatomegaly. No rebound/guarding. No obvious abdominal masses. Msk:  Strength and tone appears normal for age. Extremities: No clubbing, cyanosis or edema.  Distal pedal pulses are 2+ and equal bilaterally. Neuro:  Alert and oriented X 3. Moves all extremities spontaneously. Psych:  Responds to questions appropriately with a normal affect.  Labs: Recent Labs  Basename 08/21/12 1104   WBC 4.6   HGB 12.1*   HCT 36.2*   MCV 86.4   PLT 136*   Lab 08/21/12 1104  NA 141  K 4.6  CL 106  CO2 29  BUN 15  CREATININE 0.85  CALCIUM 9.1  PROT 6.0  BILITOT 0.6  ALKPHOS 85  ALT 16  AST 21  AMYLASE --  LIPASE --   GLUCOSE 88   Recent Labs  Basename 08/21/12 1104   CKTOTAL 108   CKMB 2.6   CKMBINDEX --   TROPONINI <0.30   Radiology/Studies: Dg Chest 2 View  08/21/2012  *RADIOLOGY REPORT*  Clinical Data: Recurring chest pain, dizziness  CHEST - 2 VIEW  Comparison: 06/20/2012  Findings: Normal heart size, mediastinal contours, and pulmonary vascularity. Lungs clear. Bones unremarkable. No pneumothorax.  IMPRESSION: Normal exam.   Original Report Authenticated By: Ulyses Southward, M.D.    EKG: sinus bradycardia, 50 bpm, isolated TWI III, mild <0.5 mm ST upsloping depressions II, aVF, otherwise no ST/T changes  ASSESSMENT AND PLAN:   1. Unstable angina pectoris- he is at overall low-risk and has minimal cardiac risk factors (age, gender), however the patient's HPI is very consistent with unstable angina. EKG and initial set of cardiac biomarkers indicate no evidence of ischemia. Plan to observe overnight and schedule for diagnostic cardiac catheterization tomorrow AM. Cycle cardiac biomarkers. Start low-dose ASA, add statin- will need LFTs  in 6 weeks if continued. NTG SL PRN. No BB 2/2 bradycardia. Risk stratify further with lipid panel, Hgb A1C. Check TSH. SCDs for DVT prophylaxis for now. Note, patient takes tadalafil and is contraindicated with NTG should he be discharged with this.   2. Normocytic anemia- history of hematuria, prostatitis/BPH s/p TURP. Check u/a. Denies frank hematuria or other active bleeding.   3.Thrombocytopenia- mild. Check u/a for hematuria.   4. H/o BPH- stable s/p TURP. Continue alfuzosin.    Signed, R. Hurman Horn, PA-C 08/21/2012, 12:47 PM   I have personally seen and examined this patient with Hurman Horn, PA-C. I agree with the assessment and plan as outlined above. He has symptoms consistent with unstable angina with few risk factors. Will admit, cycle markers and plan left heart cath in am. Risks and benefits reviewed with pt. He agrees to proceed.    Chase Barrett 2:34 PM 08/21/2012

## 2012-08-22 ENCOUNTER — Encounter (HOSPITAL_COMMUNITY)
Admission: EM | Disposition: A | Discharge status: Home / Self Care | Payer: Self-pay | Source: Ambulatory Visit | Attending: Cardiovascular Disease

## 2012-08-22 ENCOUNTER — Encounter (HOSPITAL_COMMUNITY): Payer: Self-pay | Admitting: Cardiology

## 2012-08-22 DIAGNOSIS — R079 Chest pain, unspecified: Secondary | ICD-10-CM

## 2012-08-22 HISTORY — PX: LEFT HEART CATHETERIZATION WITH CORONARY ANGIOGRAM: SHX5451

## 2012-08-22 LAB — CBC
MCHC: 32.5 g/dL (ref 30.0–36.0)
Platelets: 131 10*3/uL — ABNORMAL LOW (ref 150–400)
RDW: 15.7 % — ABNORMAL HIGH (ref 11.5–15.5)
WBC: 5.1 10*3/uL (ref 4.0–10.5)

## 2012-08-22 LAB — LIPID PANEL
Cholesterol: 144 mg/dL (ref 0–200)
HDL: 32 mg/dL — ABNORMAL LOW (ref >39)
Triglycerides: 109 mg/dL (ref <150)

## 2012-08-22 LAB — BASIC METABOLIC PANEL
Chloride: 105 mEq/L (ref 96–112)
GFR calc Af Amer: >90 mL/min (ref >90)
GFR calc non Af Amer: 86 mL/min — ABNORMAL LOW (ref >90)
Potassium: 4.2 mEq/L (ref 3.5–5.1)
Sodium: 142 mEq/L (ref 135–145)

## 2012-08-22 SURGERY — LEFT HEART CATHETERIZATION WITH CORONARY ANGIOGRAM
Anesthesia: LOCAL

## 2012-08-22 MED ORDER — SODIUM CHLORIDE 0.9 % IV SOLN: INTRAVENOUS | Status: AC

## 2012-08-22 MED ORDER — VERAPAMIL HCL 2.5 MG/ML IV SOLN
INTRAVENOUS | Status: AC
  Filled 2012-08-22: qty 2

## 2012-08-22 MED ORDER — CLOPIDOGREL BISULFATE 75 MG PO TABS
75.0000 mg | ORAL_TABLET | ORAL | Status: AC
  Administered 2012-08-23: 75 mg via ORAL
  Filled 2012-08-22 (×2): qty 1

## 2012-08-22 MED ORDER — NITROGLYCERIN 0.2 MG/ML ON CALL CATH LAB
INTRAVENOUS | Status: AC
  Filled 2012-08-22: qty 1

## 2012-08-22 MED ORDER — CLOPIDOGREL BISULFATE 300 MG PO TABS
ORAL_TABLET | ORAL | Status: AC
  Filled 2012-08-22: qty 2

## 2012-08-22 MED ORDER — HEPARIN (PORCINE) IN NACL 2-0.9 UNIT/ML-% IJ SOLN
INTRAMUSCULAR | Status: AC
  Filled 2012-08-22: qty 1000

## 2012-08-22 MED ORDER — LIDOCAINE HCL (PF) 1 % IJ SOLN
INTRAMUSCULAR | Status: AC
  Filled 2012-08-22: qty 30

## 2012-08-22 MED ORDER — SODIUM CHLORIDE 0.9 % IJ SOLN
3.0000 mL | Freq: Two times a day (BID) | INTRAMUSCULAR | Status: DC
  Administered 2012-08-22: 3 mL via INTRAVENOUS

## 2012-08-22 MED ORDER — SODIUM CHLORIDE 0.9 % IJ SOLN: 3.0000 mL | INTRAMUSCULAR | Status: DC | PRN

## 2012-08-22 MED ORDER — CLOPIDOGREL BISULFATE 75 MG PO TABS
600.0000 mg | ORAL_TABLET | Freq: Once | ORAL | Status: AC
  Administered 2012-08-22: 600 mg via ORAL

## 2012-08-22 MED ORDER — MIDAZOLAM HCL 2 MG/2ML IJ SOLN
INTRAMUSCULAR | Status: AC
  Filled 2012-08-22: qty 2

## 2012-08-22 MED ORDER — FENTANYL CITRATE 0.05 MG/ML IJ SOLN
INTRAMUSCULAR | Status: AC
  Filled 2012-08-22: qty 2

## 2012-08-22 MED ORDER — SODIUM CHLORIDE 0.9 % IV SOLN
1.0000 mL/kg/h | INTRAVENOUS | Status: DC
  Administered 2012-08-23: 1 mL/kg/h via INTRAVENOUS

## 2012-08-22 MED ORDER — SODIUM CHLORIDE 0.9 % IV SOLN: 250.0000 mL | INTRAVENOUS | Status: DC | PRN

## 2012-08-22 NOTE — Progress Notes (Signed)
I spoke and talked with Dr. Mena Goes regarding patient's urologic issues.  He had TURP in 2001, and last year had hematuria. They saw a "short urethra" but nothing else at present.  No major lesions in the bladder.  He should be relatively safe for DAPT.  We will set up for PCI tomorrow, of RCA with ?BMS and FFR of the LAD.  Will check PRU on clopidogrel.

## 2012-08-22 NOTE — Interval H&P Note (Signed)
History and Physical Interval Note:  08/22/2012 7:27 AM  Chase Barrett  has presented today for surgery, with the diagnosis of Chest pain  The various methods of treatment have been discussed with the patient. After consideration of risks, benefits and other options for treatment, the patient has consented to  Procedure(s) (LRB) with comments: LEFT HEART CATHETERIZATION WITH CORONARY ANGIOGRAM (N/A) as a surgical intervention .  The patient's history has been reviewed, patient examined, no change in status, stable for surgery.  I have reviewed the patient's chart and labs.  Questions were answered to the patient's satisfaction.     Shawnie Pons

## 2012-08-22 NOTE — Progress Notes (Signed)
Discussed options with patient in detail tonight for more than thirty minutes.  All options reviewed.  He seems to understand.  Will plan PCI tomorrow with lean toward BMS for RCA and ?medical therapy vs. FFR for LAD.  Given hematuria, probably makes sense to give treat acute lesion and medically address the LAD for now to see if he tolerates.  Will discuss with PJ in the am.

## 2012-08-22 NOTE — CV Procedure (Signed)
   Cardiac Catheterization Procedure Note  Name: Chase Barrett MRN: [id number] DOB: [birth year]  Procedure: Left Heart Cath, Selective Coronary Angiography, LV angiography  Indication: unstable angina.  Patient has a history of isolated hematuria.    Procedural details: The right groin was prepped, draped, and anesthetized with 1% lidocaine. Using modified Seldinger technique, a 5 French sheath was introduced into the right femoral artery. Standard Judkins catheters were used for coronary angiography and left ventriculography. Catheter exchanges were performed over a guidewire. There were no immediate procedural complications. The patient was transferred to the post catheterization recovery area for further monitoring.  Procedural Findings: Hemodynamics:  AO 135/67 (93) LV 127/6 No gradient    Coronary angiography: Coronary dominance: right  Left mainstem: normal  Left anterior descending (LAD): Provides a large diagonal and septal, then has 70-80% eccentric narrowing in the proximal portion of the distal LAD  Left circumflex (LCx): Provides a large OM with 30-40% mid narrowing.  The AV circumflex has 30% distal narrowing.   Right coronary artery (RCA): large caliber vessel with 90% mid lesion that is segmental.  It is slightly hazy.  The distal vessel is large.  It provides an acute marginal, two small PDA branches, and a very large PLA artery.    Left ventriculography: Left ventricular systolic function is normal, LVEF is estimated at 55-65%, there is no significant mitral regurgitation   Final Conclusions:   1.  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 History of hematuria.  Recommendations:  1.  Clopidogrel load with PRU 2.  Assess for hematuria after load 3.  Probable PCI of the RCA with ?BMS vs DES, and then observation of LAD to see how he does on clopidogrel given prior hematuria.     Patients procedure interrupted by patient with STEMI.   Have reviewed with wife and will discuss with colleaugues and Urology.  Plan PCI tomorrow.   Shawnie Pons 08/22/2012, 10:05 AM

## 2012-08-22 NOTE — Progress Notes (Signed)
Utilization review completed.  

## 2012-08-23 ENCOUNTER — Encounter (HOSPITAL_COMMUNITY)
Admission: EM | Disposition: A | Discharge status: Home / Self Care | Payer: Self-pay | Source: Ambulatory Visit | Attending: Cardiovascular Disease

## 2012-08-23 DIAGNOSIS — I2510 Atherosclerotic heart disease of native coronary artery without angina pectoris: Secondary | ICD-10-CM

## 2012-08-23 HISTORY — PX: PERCUTANEOUS CORONARY STENT INTERVENTION (PCI-S): SHX5485

## 2012-08-23 LAB — BASIC METABOLIC PANEL
Chloride: 109 mEq/L (ref 96–112)
GFR calc Af Amer: >90 mL/min (ref >90)
Potassium: 3.8 mEq/L (ref 3.5–5.1)
Sodium: 144 mEq/L (ref 135–145)

## 2012-08-23 LAB — PLATELET INHIBITION P2Y12: Platelet Function  P2Y12: 69 [PRU] — ABNORMAL LOW (ref 194–418)

## 2012-08-23 SURGERY — PERCUTANEOUS CORONARY STENT INTERVENTION (PCI-S)
Anesthesia: LOCAL

## 2012-08-23 MED ORDER — HEPARIN (PORCINE) IN NACL 2-0.9 UNIT/ML-% IJ SOLN
INTRAMUSCULAR | Status: AC
  Filled 2012-08-23: qty 1000

## 2012-08-23 MED ORDER — LIDOCAINE-EPINEPHRINE 1 %-1:100000 IJ SOLN
INTRAMUSCULAR | Status: AC
  Filled 2012-08-23: qty 1

## 2012-08-23 MED ORDER — CLOPIDOGREL BISULFATE 75 MG PO TABS
75.0000 mg | ORAL_TABLET | Freq: Every day | ORAL | Status: DC
  Administered 2012-08-24: 09:00:00 75 mg via ORAL
  Filled 2012-08-23: qty 1

## 2012-08-23 MED ORDER — BIVALIRUDIN 250 MG IV SOLR
INTRAVENOUS | Status: AC
  Filled 2012-08-23: qty 250

## 2012-08-23 MED ORDER — MIDAZOLAM HCL 2 MG/2ML IJ SOLN
INTRAMUSCULAR | Status: AC
  Filled 2012-08-23: qty 2

## 2012-08-23 MED ORDER — GUAIFENESIN-DM 100-10 MG/5ML PO SYRP
15.0000 mL | ORAL_SOLUTION | ORAL | Status: DC | PRN
  Administered 2012-08-24: 15 mL via ORAL
  Filled 2012-08-23: qty 15

## 2012-08-23 MED ORDER — SODIUM CHLORIDE 0.9 % IV SOLN
1.0000 mL/kg/h | INTRAVENOUS | Status: AC
  Administered 2012-08-23: 1 mL/kg/h via INTRAVENOUS

## 2012-08-23 MED ORDER — NITROGLYCERIN 0.2 MG/ML ON CALL CATH LAB
INTRAVENOUS | Status: AC
  Filled 2012-08-23: qty 1

## 2012-08-23 MED ORDER — LIDOCAINE HCL (PF) 1 % IJ SOLN
INTRAMUSCULAR | Status: AC
  Filled 2012-08-23: qty 30

## 2012-08-23 MED ORDER — FENTANYL CITRATE 0.05 MG/ML IJ SOLN
INTRAMUSCULAR | Status: AC
  Filled 2012-08-23: qty 2

## 2012-08-23 NOTE — CV Procedure (Signed)
   CARDIAC CATH NOTE  Name: Chase Barrett MRN: [id number] DOB: [birth year]  Procedure: PTCA and stenting of the RCA  Indication: 66 year old white male admitted with unstable angina pectoris. Diagnostic cardiac catheterization demonstrated a high-grade stenosis in the proximal right coronary up to 90%. There was also moderate stenosis in the mid LAD. After review of his study with Dr. Riley Kill we planned to stent the proximal right coronary and treat the LAD medically.  Procedural Details: The left groin was prepped, draped, and anesthetized with 1% lidocaine. Using the modified Seldinger technique, a 6 Fr sheath was introduced into the femoral  artery.  Weight-based bivalirudin was given for anticoagulation. Once a therapeutic ACT was achieved, a 6 [place] FR4 guide catheter was inserted.  A pro-water coronary guidewire was used to cross the lesion.  We initially performed intravascular ultrasound of the vessel. This demonstrated a reference diameter of 4 mm  proximal to the lesion and 3.5 mm distal to the lesion. The lesion was 18 mm in length. It had moderate calcification. The lesion was predilated with a 2.5 mm cutting balloon.  The lesion was then stented with a 3.5 x 20 mm Promus premier stent.  The stent was postdilated with a 4.0 mm noncompliant balloon.  Following PCI, there was 0% residual stenosis and TIMI-3 flow. Final angiography confirmed an excellent result. The patient tolerated the procedure well. There were no immediate procedural complications. A Mynx device was used for hemostasis. Initially there was a small to moderate hematoma that resolved with manual compression. The patient was transferred to the post catheterization recovery area for further monitoring.  Lesion Data: Vessel: RCA Percent stenosis (pre): 90% TIMI-flow (pre):  3 Stent:  3.5 x 20 mm Promus premier Percent stenosis (post): 0%  TIMI-flow (post): 3  Conclusions: Successful intracoronary stenting of the  proximal right coronary with a drug-eluting stent using intravascular ultrasound guidance.  Recommendations: Continue dual antiplatelet therapy with aspirin and Plavix for one year.  Theron [facility]ista [facility] 08/23/2012, 1:02 PM

## 2012-08-23 NOTE — H&P (View-Only) (Signed)
 Patient Name: Chase Barrett Date of Encounter: 08/23/2012     Principal Problem:  *Unstable angina pectoris Active Problems:  Normocytic anemia  Thrombocytopenia  History of BPH    SUBJECTIVE  Films reviewed with Dr. Jordan and McAlhany in detail and discussed with patient.  Plan for PCI today.    CURRENT MEDS    . alfuzosin  10 mg Oral QPC breakfast  . aspirin EC  81 mg Oral Daily  . atorvastatin  40 mg Oral q1800  . [COMPLETED] clopidogrel  600 mg Oral Once  . [COMPLETED] clopidogrel  75 mg Oral Pre-Cath  . influenza  inactive virus vaccine  0.5 mL Intramuscular Tomorrow-1000  . pneumococcal 23 valent vaccine  0.5 mL Intramuscular Tomorrow-1000  . sodium chloride  3 mL Intravenous Q12H  . [DISCONTINUED] sodium chloride  3 mL Intravenous Q12H    OBJECTIVE  Filed Vitals:   08/22/12 1400 08/22/12 1437 08/22/12 2052 08/23/12 0559  BP: 129/75 129/59 134/78 134/75  Pulse: 54 55 52 56  Temp: 98 F (36.7 C)  97.8 F (36.6 C) 97.9 F (36.6 C)  TempSrc:   Oral Oral  Resp:   18 18  Height:      Weight:      SpO2: 100%  97% 94%    Intake/Output Summary (Last 24 hours) at 08/23/12 0854 Last data filed at 08/22/12 2224  Gross per 24 hour  Intake  715.5 ml  Output   1250 ml  Net -534.5 ml   Filed Weights   08/21/12 1547 08/22/12 0500  Weight: 166 lb (75.297 kg) 166 lb (75.297 kg)    PHYSICAL EXAM  General: Pleasant, NAD. Neuro: Alert and oriented X 3. Moves all extremities spontaneously. Psych: Normal affect.   Accessory Clinical Findings  CBC  Basename 08/22/12 0546 08/21/12 1104  WBC 5.1 4.6  NEUTROABS -- --  HGB 12.4* 12.1*  HCT 38.1* 36.2*  MCV 86.6 86.4  PLT 131* 136*   Basic Metabolic Panel  Basename 08/23/12 0500 08/22/12 0546  NA 144 142  K 3.8 4.2  CL 109 105  CO2 25 28  GLUCOSE 101* 97  BUN 13 15  CREATININE 0.88 0.92  CALCIUM 8.8 8.7  MG -- --  PHOS -- --   Liver Function Tests  Basename 08/21/12 1104  AST 21    ALT 16  ALKPHOS 85  BILITOT 0.6  PROT 6.0  ALBUMIN 3.8   No results found for this basename: LIPASE:2,AMYLASE:2 in the last 72 hours Cardiac Enzymes  Basename 08/21/12 2247 08/21/12 1718 08/21/12 1104  CKTOTAL -- -- 108  CKMB -- -- 2.6  CKMBINDEX -- -- --  TROPONINI <0.30 <0.30 <0.30   BNP No components found with this basename: POCBNP:3 D-Dimer No results found for this basename: DDIMER:2 in the last 72 hours Hemoglobin A1C  Basename 08/21/12 1718  HGBA1C 5.3   Fasting Lipid Panel  Basename 08/22/12 0546  CHOL 144  HDL 32*  LDLCALC 90  TRIG 109  CHOLHDL 4.5  LDLDIRECT --   Thyroid Function Tests  Basename 08/21/12 1718  TSH 0.791  T4TOTAL --  T3FREE --  THYROIDAB --      Radiology/Studies  Dg Chest 2 View  08/21/2012  *RADIOLOGY REPORT*  Clinical Data: Recurring chest pain, dizziness  CHEST - 2 VIEW  Comparison: 06/20/2012  Findings: Normal heart size, mediastinal contours, and pulmonary vascularity. Lungs clear. Bones unremarkable. No pneumothorax.  IMPRESSION: Normal exam.   Original Report Authenticated By: Mark   Boles, M.D.     ASSESSMENT AND PLAN  I have extensively reviewed with the patient the risks, benefits, and alternatives.  Our leaning at present is for treatment of the culprit, then assessment over time of response to DAPT.  He can be assessed as an OP by Dr. CMAC regarding residual ischemia in the LAD territory, and this will give time for assessment of response to medical therapy, and tolerance of DAPT with history of hematuria.    Signed, Pernell Lenoir MD, FACC, FSCAI 

## 2012-08-23 NOTE — Progress Notes (Signed)
Patient Name: Chase Barrett Date of Encounter: 08/23/2012     Principal Problem:  *Unstable angina pectoris Active Problems:  Normocytic anemia  Thrombocytopenia  History of BPH    SUBJECTIVE  Films reviewed with Dr. Swaziland and Clifton Watson in detail and discussed with patient.  Plan for PCI today.    CURRENT MEDS    . alfuzosin  10 mg Oral QPC breakfast  . aspirin EC  81 mg Oral Daily  . atorvastatin  40 mg Oral q1800  . [COMPLETED] clopidogrel  600 mg Oral Once  . [COMPLETED] clopidogrel  75 mg Oral Pre-Cath  . influenza  inactive virus vaccine  0.5 mL Intramuscular Tomorrow-1000  . pneumococcal 23 valent vaccine  0.5 mL Intramuscular Tomorrow-1000  . sodium chloride  3 mL Intravenous Q12H  . [DISCONTINUED] sodium chloride  3 mL Intravenous Q12H    OBJECTIVE  Filed Vitals:   08/22/12 1400 08/22/12 1437 08/22/12 2052 08/23/12 0559  BP: 129/75 129/59 134/78 134/75  Pulse: 54 55 52 56  Temp: 98 F (36.7 C)  97.8 F (36.6 C) 97.9 F (36.6 C)  TempSrc:   Oral Oral  Resp:   18 18  Height:      Weight:      SpO2: 100%  97% 94%    Intake/Output Summary (Last 24 hours) at 08/23/12 0854 Last data filed at 08/22/12 2224  Gross per 24 hour  Intake  715.5 ml  Output   1250 ml  Net -534.5 ml   Filed Weights   08/21/12 1547 08/22/12 0500  Weight: 166 lb (75.297 kg) 166 lb (75.297 kg)    PHYSICAL EXAM  General: Pleasant, NAD. Neuro: Alert and oriented X 3. Moves all extremities spontaneously. Psych: Normal affect.   Accessory Clinical Findings  CBC  Basename 08/22/12 0546 08/21/12 1104  WBC 5.1 4.6  NEUTROABS -- --  HGB 12.4* 12.1*  HCT 38.1* 36.2*  MCV 86.6 86.4  PLT 131* 136*   Basic Metabolic Panel  Basename 08/23/12 0500 08/22/12 0546  NA 144 142  K 3.8 4.2  CL 109 105  CO2 25 28  GLUCOSE 101* 97  BUN 13 15  CREATININE 0.88 0.92  CALCIUM 8.8 8.7  MG -- --  PHOS -- --   Liver Function Tests  Basename 08/21/12 1104  AST 21    ALT 16  ALKPHOS 85  BILITOT 0.6  PROT 6.0  ALBUMIN 3.8   No results found for this basename: LIPASE:2,AMYLASE:2 in the last 72 hours Cardiac Enzymes  Basename 08/21/12 2247 08/21/12 1718 08/21/12 1104  CKTOTAL -- -- 108  CKMB -- -- 2.6  CKMBINDEX -- -- --  TROPONINI <0.30 <0.30 <0.30   BNP No components found with this basename: POCBNP:3 D-Dimer No results found for this basename: DDIMER:2 in the last 72 hours Hemoglobin A1C  Basename 08/21/12 1718  HGBA1C 5.3   Fasting Lipid Panel  Basename 08/22/12 0546  CHOL 144  HDL 32*  LDLCALC 90  TRIG 454  CHOLHDL 4.5  LDLDIRECT --   Thyroid Function Tests  Basename 08/21/12 1718  TSH 0.791  T4TOTAL --  T3FREE --  THYROIDAB --      Radiology/Studies  Dg Chest 2 View  08/21/2012  *RADIOLOGY REPORT*  Clinical Data: Recurring chest pain, dizziness  CHEST - 2 VIEW  Comparison: 06/20/2012  Findings: Normal heart size, mediastinal contours, and pulmonary vascularity. Lungs clear. Bones unremarkable. No pneumothorax.  IMPRESSION: Normal exam.   Original Report Authenticated By: Loraine Leriche  Tyron Russell, M.D.     ASSESSMENT AND PLAN  I have extensively reviewed with the patient the risks, benefits, and alternatives.  Our leaning at present is for treatment of the culprit, then assessment over time of response to DAPT.  He can be assessed as an OP by Dr. Esmond Camper regarding residual ischemia in the LAD territory, and this will give time for assessment of response to medical therapy, and tolerance of DAPT with history of hematuria.    Signed, Shawnie Pons MD, [facility], FSCAI

## 2012-08-23 NOTE — Interval H&P Note (Signed)
History and Physical Interval Note:  08/23/2012 12:00 PM  Chase Barrett  has presented today for surgery, with the diagnosis of blockage  The various methods of treatment have been discussed with the patient and family. After consideration of risks, benefits and other options for treatment, the patient has consented to  Procedure(s) (LRB) with comments: PERCUTANEOUS CORONARY STENT INTERVENTION (PCI-S) (N/A) as a surgical intervention .  The patient's history has been reviewed, patient examined, no change in status, stable for surgery.  I have reviewed the patient's chart and labs.  Questions were answered to the patient's satisfaction.     Theron Arista [facility] 08/23/2012 12:00 PM

## 2012-08-24 ENCOUNTER — Encounter (HOSPITAL_COMMUNITY): Payer: Self-pay | Admitting: Physician Assistant

## 2012-08-24 LAB — CBC
HCT: 37 % — ABNORMAL LOW (ref 39.0–52.0)
Hemoglobin: 12.4 g/dL — ABNORMAL LOW (ref 13.0–17.0)
MCH: 28.7 pg (ref 26.0–34.0)
MCV: 85.6 fL (ref 78.0–100.0)
Platelets: 136 10*3/uL — ABNORMAL LOW (ref 150–400)
RBC: 4.32 MIL/uL (ref 4.22–5.81)
WBC: 6.7 10*3/uL (ref 4.0–10.5)

## 2012-08-24 LAB — BASIC METABOLIC PANEL
BUN: 14 mg/dL (ref 6–23)
CO2: 24 mEq/L (ref 19–32)
Calcium: 8.6 mg/dL (ref 8.4–10.5)
Chloride: 108 mEq/L (ref 96–112)
Creatinine, Ser: 0.84 mg/dL (ref 0.50–1.35)
Glucose, Bld: 109 mg/dL — ABNORMAL HIGH (ref 70–99)

## 2012-08-24 MED ORDER — CLOPIDOGREL BISULFATE 75 MG PO TABS: 75.0000 mg | ORAL_TABLET | Freq: Every day | ORAL | Status: DC

## 2012-08-24 MED ORDER — NITROGLYCERIN 0.4 MG SL SUBL: 0.4000 mg | SUBLINGUAL_TABLET | SUBLINGUAL | Status: AC | PRN

## 2012-08-24 MED ORDER — ACETAMINOPHEN 325 MG PO TABS: 325.0000 mg | ORAL_TABLET | ORAL | Status: DC | PRN

## 2012-08-24 MED ORDER — ATORVASTATIN CALCIUM 40 MG PO TABS: 40.0000 mg | ORAL_TABLET | Freq: Every day | ORAL | Status: DC

## 2012-08-24 MED ORDER — ASPIRIN 81 MG PO TBEC: 81.0000 mg | DELAYED_RELEASE_TABLET | Freq: Every day | ORAL | Status: AC

## 2012-08-24 MED ORDER — METOPROLOL SUCCINATE ER 25 MG PO TB24
25.0000 mg | ORAL_TABLET | Freq: Every day | ORAL | Status: DC
  Filled 2012-08-24: qty 1

## 2012-08-24 MED ORDER — METOPROLOL SUCCINATE ER 25 MG PO TB24: 25.0000 mg | ORAL_TABLET | Freq: Every day | ORAL | Status: DC

## 2012-08-24 MED ORDER — TADALAFIL 20 MG PO TABS: 20.0000 mg | ORAL_TABLET | Freq: Every day | ORAL | Status: AC | PRN

## 2012-08-24 NOTE — Progress Notes (Signed)
TELEMETRY: Reviewed telemetry pt in NSR, brief run of atrial tachycardia this am.: Filed Vitals:   08/23/12 2303 08/24/12 0044 08/24/12 0356 08/24/12 0700  BP: 134/64  126/69 115/63  Pulse:   68 63  Temp: 98 F (36.7 C)  98.2 F (36.8 C) 97.7 F (36.5 C)  TempSrc: Oral  Oral Oral  Resp: 16  15 19   Height:      Weight:  73.6 kg (162 lb 4.1 oz)    SpO2: 95%  95% 92%    Intake/Output Summary (Last 24 hours) at 08/24/12 0812 Last data filed at 08/24/12 0356  Gross per 24 hour  Intake    753 ml  Output    700 ml  Net     53 ml    SUBJECTIVE Feels well today. No chest pain or SOB. Ambulating in halls. No palpitations.  LABS: Basic Metabolic Panel:  Basename 08/24/12 0500 08/23/12 0500  NA 141 144  K 4.0 3.8  CL 108 109  CO2 24 25  GLUCOSE 109* 101*  BUN 14 13  CREATININE 0.84 0.88  CALCIUM 8.6 8.8  MG -- --  PHOS -- --   Liver Function Tests:  Basename 08/21/12 1104  AST 21  ALT 16  ALKPHOS 85  BILITOT 0.6  PROT 6.0  ALBUMIN 3.8   No results found for this basename: LIPASE:2,AMYLASE:2 in the last 72 hours CBC:  Basename 08/24/12 0500 08/22/12 0546  WBC 6.7 5.1  NEUTROABS -- --  HGB 12.4* 12.4*  HCT 37.0* 38.1*  MCV 85.6 86.6  PLT 136* 131*   Cardiac Enzymes:  Basename 08/21/12 2247 08/21/12 1718 08/21/12 1104  CKTOTAL -- -- 108  CKMB -- -- 2.6  CKMBINDEX -- -- --  TROPONINI <0.30 <0.30 <0.30   Hemoglobin A1C:  Basename 08/21/12 1718  HGBA1C 5.3   Fasting Lipid Panel:  Basename 08/22/12 0546  CHOL 144  HDL 32*  LDLCALC 90  TRIG 454  CHOLHDL 4.5  LDLDIRECT --   Thyroid Function Tests:  Basename 08/21/12 1718  TSH 0.791  T4TOTAL --  T3FREE --  THYROIDAB --    Radiology/Studies:  Dg Chest 2 View  08/21/2012  *RADIOLOGY REPORT*  Clinical Data: Recurring chest pain, dizziness  CHEST - 2 VIEW  Comparison: 06/20/2012  Findings: Normal heart size, mediastinal contours, and pulmonary vascularity. Lungs clear. Bones unremarkable. No  pneumothorax.  IMPRESSION: Normal exam.   Original Report Authenticated By: Ulyses Southward, M.D.     PHYSICAL EXAM General: Well developed, well nourished, in no acute distress. Head: Normocephalic, atraumatic, sclera non-icteric, no xanthomas, nares are without discharge. Neck: Negative for carotid bruits. JVD not elevated. Lungs: Clear bilaterally to auscultation without wheezes, rales, or rhonchi. Breathing is unlabored. Heart: RRR S1 S2 without murmurs, rubs, or gallops.  Abdomen: Soft, non-tender, non-distended with normoactive bowel sounds. No hepatomegaly. No rebound/guarding. No obvious abdominal masses. Msk:  Strength and tone appears normal for age. Extremities: No clubbing, cyanosis or edema.  Distal pedal pulses are 2+ and equal bilaterally. No left groin hematoma. Neuro: Alert and oriented X 3. Moves all extremities spontaneously. Psych:  Responds to questions appropriately with a normal affect.  ASSESSMENT AND PLAN: 1. Unstable angina. S/p DES to RCA-culprit lesion. Moderate mid LAD disease- will treat medically. On ASA and plavix for one year. OK for discharge today. Consider stress test later to assess LAD on medical Rx.   2. Hyperlipidemia on lipitor.  3. PAT asymptomatic. Will start toprol XL 25 mg daily.  4. History of hematuria in past. S/p TURP 2001. Cystoscopy last year unremarkable.  Principal Problem:  *Unstable angina pectoris Active Problems:  Normocytic anemia  Thrombocytopenia  History of BPH    Signed, Peter Swaziland MD,FACC 08/24/2012 8:16 AM

## 2012-08-24 NOTE — Discharge Summary (Signed)
Patient seen and examined and history reviewed. Agree with above findings and plan. See rounding note earlier today.   Peter JordanMD,FACC 08/24/2012 12:34 PM     

## 2012-08-24 NOTE — Progress Notes (Signed)
CARDIAC REHAB PHASE I   PRE:  Rate/Rhythm: 63 sinus rhythm  BP:  Supine:   Sitting: 110/64  Standing:  SaO2: 97%  MODE:  Ambulation: 600 ft   POST:  Rate/Rhythem: 77 sr  BP:  Supine:   Sitting: 127/55 Standing:    SaO2: 98%  755-840 Pt ambulated in hallway without difficulty.  Pt education completed.  Pt oriented on phase II cardiac rehab.  At pt request, referral will be sent to [facility].  [place], [place] for outpatient cardiac rehab.  Understanding verbalized  Dan Europe

## 2012-08-24 NOTE — Discharge Summary (Signed)
Discharge Summary   Patient ID: Chase Barrett MRN: [id number], DOB/AGE: 1946/08/03 66 y.o. Admit date: 08/21/2012 D/C date:     08/24/2012  Primary Cardiologist: New to Cardiology, to be established in [place]  Primary Discharge Diagnoses:  1. Unstable angina/CAD - s/p DES to RCA 08/23/12, residual moderate LAD dz for med rx (Dr. Swaziland suggested to consider stress test later to assess LAD on med rx) - normal LV function by cath 08/22/12 - statin started thus will need f/u lipids/LFTs 2. Hyperlipidemia 3. PAT  4. Normocytic anemia - instructed to f/u PCP 5. Thrombocytopenia 6.  History of hematuria - Dr. Riley Kill discussed with Dr. Mena Goes - no recent hematuria, ok for DAPT - reported cystoscopy last year unremarkable (short urethra, no major bladder lesions)  Secondary Discharge Diagnoses:  1. BPH s/p TURP 2. DJD 3. OA 4. H/o lymphedema 5. Hepatic cyst 6. Hepatic hemangioma 7. Diverticulosis  Hospital Course: Chase Barrett is a 66  y/o male with no prior cardiac history, but history of BPH s/p TURP and hematuria who presented [facility] ED with progressively worsening exertional chest pain. Regarding his hematuria, he underwent abd/pelv CT in 06/2011 revealing no acute etiology. Hemangioma detected. He reported a prior history of urosepsis sourced from prostatitis. He is fairly active and swims, walks, and bikes frequently. However last week he began experiencing exertional chest discomfort with these activities. He reported associated shortness of breath, lightheadedness and diaphoresis. In the ED, EKG is w/o ischemic changes. Cardiac biomarkers WNL x 1. CXR unremarkable. BMET unremarkable. CBC with mild normocytic anemia and mild thrombocytopenia. He denied frank hematuria. (Dr. Riley Kill spoke to Dr. Mena Goes regarding patient's urologic issues. They saw a "short urethra" but nothing else at present. No major lesions in the bladder. He should be relatively safe for DAPT.) He underwent cath 12/5  demonstrating 2V CAD with high grade RCA lesion and moderate distal LAD disease. The patient subsequently underwent IVUS-guided DES placement to RCA. ASA/Plavix x 1 year was recommended. His LAD disease will be treated medically - Dr. Swaziland suggested to consider stress test later to assess LAD on med rx. PRU was 69. The patient had a brief run of PAT today on telemetry. Toprol was added. He ambulated well with cardiac rehab. Dr. Swaziland feels he may return to work mid-week next week. Dr. Swaziland has seen and examined the patient today and feels he is stable for discharge.   Discharge Vitals: Blood pressure 115/63, pulse 63, temperature 97.7 F (36.5 C), temperature source Oral, resp. rate 19, height 5\' 10"  (1.778 m), weight 162 lb 4.1 oz (73.6 kg), SpO2 92.00%.  Labs: Lab Results  Component Value Date   WBC 6.7 08/24/2012   HGB 12.4* 08/24/2012   HCT 37.0* 08/24/2012   MCV 85.6 08/24/2012   PLT 136* 08/24/2012     Lab 08/24/12 0500 08/21/12 1104  NA 141 --  K 4.0 --  CL 108 --  CO2 24 --  BUN 14 --  CREATININE 0.84 --  CALCIUM 8.6 --  PROT -- 6.0  BILITOT -- 0.6  ALKPHOS -- 85  ALT -- 16  AST -- 21  GLUCOSE 109* --    Basename 08/21/12 2247 08/21/12 1718 08/21/12 1104  CKTOTAL -- -- 108  CKMB -- -- 2.6  TROPONINI <0.30 <0.30 <0.30   Lab Results  Component Value Date   CHOL 144 08/22/2012   HDL 32* 08/22/2012   LDLCALC 90 08/22/2012   TRIG 109 08/22/2012  Diagnostic Studies/Procedures   1. Cardiac catheterization this admission, please see full report and above for summary. 2. Dg Chest 2 View 08/21/2012  *RADIOLOGY REPORT*  Clinical Data: Recurring chest pain, dizziness  CHEST - 2 VIEW  Comparison: 06/20/2012  Findings: Normal heart size, mediastinal contours, and pulmonary vascularity. Lungs clear. Bones unremarkable. No pneumothorax.  IMPRESSION: Normal exam.   Original Report Authenticated By: Ulyses Southward, M.D.     Discharge Medications   Current Discharge Medication  List    START taking these medications   Details  acetaminophen (TYLENOL) 325 MG tablet Take 1-2 tablets (325-650 mg total) by mouth every 4 (four) hours as needed for pain.    aspirin EC 81 MG EC tablet Take 1 tablet (81 mg total) by mouth daily.    atorvastatin (LIPITOR) 40 MG tablet Take 1 tablet (40 mg total) by mouth daily. Qty: 30 tablet, Refills: 6    clopidogrel (PLAVIX) 75 MG tablet Take 1 tablet (75 mg total) by mouth daily with breakfast. Qty: 30 tablet, Refills: 11    metoprolol succinate (TOPROL-XL) 25 MG 24 hr tablet Take 1 tablet (25 mg total) by mouth daily. Qty: 30 tablet, Refills: 6    nitroGLYCERIN (NITROSTAT) 0.4 MG SL tablet Place 1 tablet (0.4 mg total) under the tongue every 5 (five) minutes as needed for chest pain (up to 3 doses). WARNING: Do not take this medicine if you have taken Cialis (Tadalafil) within the last 24 hours. Qty: 25 tablet, Refills: 4      CONTINUE these medications which have CHANGED   Details  tadalafil (CIALIS) 20 MG tablet Take 1 tablet (20 mg total) by mouth daily as needed. WARNING: Do not take this medicine if you have taken Nitroglycerin within the last 24 hours.      CONTINUE these medications which have NOT CHANGED   Details  alfuzosin (UROXATRAL) 10 MG 24 hr tablet Take 10 mg by mouth daily.      Multiple Vitamin (MULTIVITAMIN WITH MINERALS) TABS Take 1 tablet by mouth daily.      STOP taking these medications     naproxen sodium (ANAPROX) 220 MG tablet Comments:  Reason for Stopping:          Disposition   The patient will be discharged in stable condition to home. Discharge Orders    Future Orders Please Complete By Expires   Amb Referral to Cardiac Rehabilitation      Comments:   [facility], [place]   Diet - low sodium heart healthy      Increase activity slowly      Comments:   No driving for 2 days. No lifting over 5 lbs for 1 week. No sexual activity for 1 week. You may return to work on 08/28/12.  Keep procedure site clean & dry. If you notice increased pain, swelling, bleeding or pus, call/return!  You may shower, but no soaking baths/hot tubs/pools for 1 week.   Discharge instructions      Comments:   Medicines like Naproxen can increase risk of stomach bleeding while taking aspirin/Plavix. Talk to primary doctor about alternatives. You may try Tylenol as directed over-the-counter instead.     Follow-up Information    Follow up with Selinda Flavin, MD. (Please follow up with your primary care doctor for further evaluation of your mild anemia (your blood count is borderline low))    Contact information:   [address] [place] [address] [phone]  Follow up with [facility]. (Our office will call you to schedule follow-up appointment)    Contact information:   [facility] [address] [place], [address] [place] [address] [phone]            Duration of Discharge Encounter: Greater than 30 minutes including physician and PA time.  Signed, Ronie Spies PA-C 08/24/2012, 9:33 AM

## 2012-08-26 MED FILL — Dextrose Inj 5%: INTRAVENOUS | Qty: 50 | Status: AC

## 2012-09-06 ENCOUNTER — Ambulatory Visit (INDEPENDENT_AMBULATORY_CARE_PROVIDER_SITE_OTHER): Payer: Medicare Other | Admitting: Physician Assistant

## 2012-09-06 ENCOUNTER — Encounter: Payer: Self-pay | Admitting: Physician Assistant

## 2012-09-06 VITALS — BP 122/62 | HR 62 | Ht 70.0 in | Wt 170.0 lb

## 2012-09-06 DIAGNOSIS — E785 Hyperlipidemia, unspecified: Secondary | ICD-10-CM

## 2012-09-06 DIAGNOSIS — R079 Chest pain, unspecified: Secondary | ICD-10-CM

## 2012-09-06 DIAGNOSIS — I2510 Atherosclerotic heart disease of native coronary artery without angina pectoris: Secondary | ICD-10-CM

## 2012-09-06 DIAGNOSIS — R1032 Left lower quadrant pain: Secondary | ICD-10-CM

## 2012-09-06 NOTE — Progress Notes (Signed)
[address] [place], [place]  [address] Phone: [phone], Fax:  [phone]  Date:  09/06/2012   Name:  Chase Barrett   DOB:  Aug 07, 1946   MRN:  [id number]  PCP:  Selinda Flavin, MD  Primary Cardiologist:  He will be established with Dr. Peter Swaziland  Primary Electrophysiologist:  None    History of Present Illness: Chase Barrett is a 66 y.o. male who returns for followup after recent admission for unstable angina.  He has a hx of BPH, s/p TURP and hematuria. CT scan 10/12 demonstrated no acute etiology for his hematuria. Of note, he has a past prior history of urosepsis from prostatitis. Echocardiogram 11/09: EF 55%, mild MR, mild LAE.  He was admitted 12/4-12/7. He presented with worsening exertional chest pain. Cardiac markers were negative. LHC 08/22/12:  dLAD 80%, mOM 30-40%, mRCA 90%, EF 55-65%. Case reviewed with patient's urologist, Dr. Mena Goes. It was felt the patient would be safe to take dual antiplatelet therapy. He subsequently was referred for PCI 08/23/12: Promus Premier DES to the RCA. Medical therapy was recommended for his residual CAD.  Since d/c, he has had some left-sided chest discomfort. He cannot really describe this. It happens at any time. It is not related to meals or exercise. He denies pleuritic chest pain. He also notes left groin pain. He has had a significant amount of bruising. He denies dyspnea, syncope, orthopnea, PND. He denies fevers, chills, cough.  Labs (12/13):   K 4, creatinine 0.84, ALT 16, LDL 90, HDL 32, Hgb 12.4, PLT 136K, PRU 69, TSH 0.791  Wt Readings from Last 3 Encounters:  09/06/12 170 lb (77.111 kg)  08/24/12 162 lb 4.1 oz (73.6 kg)  08/24/12 162 lb 4.1 oz (73.6 kg)     Past Medical History  Diagnosis Date  . BPH (benign prostatic hyperplasia)     s/p TURP  . Arthritis   . Diverticulosis   . Hepatic cyst   . Hepatic hemangioma   . Hemorrhoids   . DJD (degenerative joint disease)   . CAD  (coronary artery disease)     a. s/p DES to RCA 08/23/12, residual moderate LAD dz for med rx (Dr. Swaziland suggested to consider stress test later to assess LAD on med rx).  . Hyperlipidemia   . PAT (paroxysmal atrial tachycardia)     Noted 08/2012 - BB started  . Anemia     Noted 08/2012  . Thrombocytopenia     Noted 08/2012  . Hematuria     Workup 2012 - no recent hematuria, reported cystoscopy last year unremarkable (short urethra, no major bladder lesions)  . DJD (degenerative joint disease)     Current Outpatient Prescriptions  Medication Sig Dispense Refill  . alfuzosin (UROXATRAL) 10 MG 24 hr tablet Take 10 mg by mouth daily.        Marland Kitchen aspirin EC 81 MG EC tablet Take 1 tablet (81 mg total) by mouth daily.      Marland Kitchen atorvastatin (LIPITOR) 40 MG tablet Take 1 tablet (40 mg total) by mouth daily.  30 tablet  6  . clopidogrel (PLAVIX) 75 MG tablet Take 1 tablet (75 mg total) by mouth daily with breakfast.  30 tablet  11  . metoprolol succinate (TOPROL-XL) 25 MG 24 hr tablet Take 1 tablet (25 mg total) by mouth daily.  30 tablet  6  . Multiple Vitamin (MULTIVITAMIN WITH MINERALS) TABS Take 1 tablet by mouth daily.      Marland Kitchen  nitroGLYCERIN (NITROSTAT) 0.4 MG SL tablet Place 1 tablet (0.4 mg total) under the tongue every 5 (five) minutes as needed for chest pain (up to 3 doses). WARNING: Do not take this medicine if you have taken Cialis (Tadalafil) within the last 24 hours.  25 tablet  4  . tadalafil (CIALIS) 20 MG tablet Take 1 tablet (20 mg total) by mouth daily as needed. WARNING: Do not take this medicine if you have taken Nitroglycerin within the last 24 hours.        Allergies:   No Known Allergies  Social History:  The patient  reports that he has never smoked. He has never used smokeless tobacco. He reports that he drinks alcohol. He reports that he does not use illicit drugs.   ROS:  Please see the history of present illness.   He denies melena, hematochezia, hematuria.   All other  systems reviewed and negative.   PHYSICAL EXAM: VS:  BP 122/62  Pulse 62  Ht 5\' 10"  (1.778 m)  Wt 170 lb (77.111 kg)  BMI 24.39 kg/m2  SpO2 99% Well nourished, well developed, in no acute distress HEENT: normal Neck: no JVD Cardiac:  normal S1, S2; RRR; no murmur Lungs:  clear to auscultation bilaterally, no wheezing, rhonchi or rales Abd: soft, nontender, no hepatomegaly Ext: no edema; right groin without hematoma or bruits; left groin with small pulsatile mass over the femoral artery, no bruit, extensive bruising down the inner thigh Skin: warm and dry Neuro:  CNs 2-12 intact, no focal abnormalities noted  EKG:  NSR, HR 62, normal axis, nonspecific ST-T wave changes      ASSESSMENT AND PLAN:  1. Chest Pain:   Symptoms are quite atypical. It is not like his previous angina. He did have significant LAD disease. The notes from the hospital indicate that consideration could be given for stress Myoview to assess for ischemia in the LAD territory. Given his ongoing symptoms and residual disease, I will arrange ETT-Myoview.  2. Coronary Artery Disease:   We discussed the importance of dual antiplatelet therapy.  Proceed with Myoview as noted.  3. Left Groin Hematoma:   Suspect his left groin pain is related to a small hematoma. However, the mass is somewhat pulsatile. I will have him undergo ultrasound rule out pseudoaneurysm. CBC done earlier this week with PCP.  We will request those records.  4. Hyperlipidemia:   Check Lipids and LFTs in 6 weeks.    5. Hematuria:   He has not noted a recurrence on both Plavix and aspirin. He will continue followup with urology as indicated.  6. Disposition:   Patient was to establish in [place] after d/c.  However, he works in [employer] and wanted to follow up here.  Dr. Riley Kill did his initial LHC (but he is retiring).  Dr. Swaziland did his PCI.  I will have him followup with Dr. Swaziland in 3-4 weeks after his stress test.  Signed, Tereso Newcomer, PA-C  8:40 AM  09/06/2012

## 2012-09-06 NOTE — Patient Instructions (Addendum)
Your physician has requested that you have en exercise stress myoview. For further information please visit [email or web address] Please follow instruction sheet, as given.  Your physician recommends that you return for lab work in: TODAY CBC W/DIFF  Your physician recommends that you schedule a follow-up appointment in: 2-3 WEEKS WITH DR. Swaziland  Your physician recommends that you return for lab work in: 6-8 WEEKS FOR FASTING LIPID AND LIVER PANEL  Your physician has requested that you have a lower extremity arterial duplex LEFT GROIN PAIN AND R/O PSEUDOANEURYSM; 09/09/12 @ 9 AM. This test is an ultrasound of the arteries in the legs or arms. It looks at arterial blood flow in the legs and arms. Allow one hour for Lower and Upper Arterial scans. There are no restrictions or special instructions   Your physician recommends that you continue on your current medications as directed. Please refer to the Current Medication list given to you today.

## 2012-09-09 ENCOUNTER — Encounter (INDEPENDENT_AMBULATORY_CARE_PROVIDER_SITE_OTHER): Payer: Medicare Other

## 2012-09-09 DIAGNOSIS — M79609 Pain in unspecified limb: Secondary | ICD-10-CM

## 2012-09-09 DIAGNOSIS — R1032 Left lower quadrant pain: Secondary | ICD-10-CM

## 2012-09-10 ENCOUNTER — Ambulatory Visit (HOSPITAL_COMMUNITY): Payer: Managed Care, Other (non HMO) | Attending: Cardiology | Admitting: Radiology

## 2012-09-10 VITALS — BP 129/68 | Ht 70.0 in | Wt 167.0 lb

## 2012-09-10 DIAGNOSIS — R0602 Shortness of breath: Secondary | ICD-10-CM | POA: Insufficient documentation

## 2012-09-10 DIAGNOSIS — I2510 Atherosclerotic heart disease of native coronary artery without angina pectoris: Secondary | ICD-10-CM

## 2012-09-10 DIAGNOSIS — Z8249 Family history of ischemic heart disease and other diseases of the circulatory system: Secondary | ICD-10-CM | POA: Insufficient documentation

## 2012-09-10 DIAGNOSIS — R002 Palpitations: Secondary | ICD-10-CM | POA: Insufficient documentation

## 2012-09-10 DIAGNOSIS — R079 Chest pain, unspecified: Secondary | ICD-10-CM | POA: Insufficient documentation

## 2012-09-10 MED ORDER — TECHNETIUM TC 99M SESTAMIBI GENERIC - CARDIOLITE
33.0000 | Freq: Once | INTRAVENOUS | Status: AC | PRN
  Administered 2012-09-10: 33 via INTRAVENOUS

## 2012-09-10 MED ORDER — TECHNETIUM TC 99M SESTAMIBI GENERIC - CARDIOLITE
11.0000 | Freq: Once | INTRAVENOUS | Status: AC | PRN
  Administered 2012-09-10: 11 via INTRAVENOUS

## 2012-09-10 NOTE — Progress Notes (Signed)
[facility] SITE 3 NUCLEAR MED [address] [place], [place] [address] [phone]    Cardiology Nuclear Med Study  Chase Barrett is a 66 y.o. male     MRN : [id number]     DOB: Sep 26, 1945  Procedure Date: 09/10/2012  Nuclear Med Background Indication for Stress Test:  Evaluation for Ischemia, PTCA/Stent Patency, and Patient seen in hospital on 08-24-12 for Chest pain, enzymes negative> Cath> Stent of RCA History: '01 Myocardial Perfusion Study-ok per patient, 11-09 Echo: EF=55%, mild MR, 08-22-12 Cath: 90% RCA> Stent, residual LAD 80%, OM 30-40%, EF=55-65% Cardiac Risk Factors: Family History - CAD and Lipids  Symptoms: Chest Pain with/without exertion (last occurrence yesterday),  Palpitations, Rapid HR and SOB   Nuclear Pre-Procedure Caffeine/Decaff Intake:  None NPO After: 6:30pm   Lungs:  clear O2 Sat: 98% on room air. IV 0.9% NS with Angio Cath:  20g  IV Site: R Antecubital x 1, tolerated well IV Started by:  Irean Hong, RN  Chest Size (in):  40 Cup Size: n/a  Height: 5\' 10"  (1.778 m)  Weight:  167 lb (75.751 kg)  BMI:  Body mass index is 23.96 kg/(m^2). Tech Comments:  Held Toprol x 48 hrs    Nuclear Med Study 1 or 2 day study: 1 day  Stress Test Type:  Stress  Reading MD: Willa Rough, MD  Order Authorizing Provider:  Peter Swaziland, MD, and Tereso Newcomer, [facility]  Resting Radionuclide: Technetium 71m Sestamibi  Resting Radionuclide Dose: 11.0 mCi   Stress Radionuclide:  Technetium 40m Sestamibi  Stress Radionuclide Dose: 33.0 mCi           Stress Protocol Rest HR: 45 Stress HR: 137  Rest BP: 129/68 Stress BP: 184/62  Exercise Time (min): 10:00 METS: 11.7   Predicted Max HR: 154 bpm % Max HR: 88.96 bpm Rate Pressure Product: 21308    Dose of Adenosine (mg):  n/a Dose of Lexiscan: n/a mg  Dose of Atropine (mg): n/a Dose of Dobutamine: n/a mcg/kg/min (at max HR)  Stress Test Technologist: Irean Hong, RN  Nuclear Technologist:  Leonia Corona, RT-N      Rest Procedure:  Myocardial perfusion imaging was performed at rest 45 minutes following the intravenous administration of Technetium 35m Sestamibi. Rest ECG: Sinus bradycardia  Stress Procedure:  The patient exercised on the treadmill utilizing the Bruce Protocol for 10 minutes, RPE 15.The patient stopped due to fatigue and denied any chest pain. There were occasional PAC's. There was a drop in BP to 156/59 immediately post exercise.Technetium 95m Sestamibi was injected at peak exercise and myocardial perfusion imaging was performed after a brief delay. Stress ECG: No significant change from baseline ECG  QPS Raw Data Images:  Normal; no motion artifact; normal heart/lung ratio. Stress Images:  Normal homogeneous uptake in all areas of the myocardium. Rest Images:  Normal homogeneous uptake in all areas of the myocardium. Subtraction (SDS):  No evidence of ischemia. Transient Ischemic Dilatation (Normal <1.22):  1.00 Lung/Heart Ratio (Normal <0.45):  .34  Quantitative Gated Spect Images QGS EDV:  114 ml QGS ESV:  45 ml  Impression Exercise Capacity:  Good exercise capacity. BP Response:  Normal blood pressure response. Clinical Symptoms:  No significant symptoms noted. ECG Impression:  No significant ST segment change suggestive of ischemia. Comparison with Prior Nuclear Study: No images to compare  Overall Impression:  Normal stress nuclear study.  LV Ejection Fraction: 60%.  LV Wall Motion:  Normal Wall Motion.  Chase Gens  Myrtis Ser, MD

## 2012-09-13 ENCOUNTER — Ambulatory Visit: Payer: Medicare Other | Admitting: Physician Assistant

## 2012-09-13 ENCOUNTER — Encounter: Payer: Self-pay | Admitting: Physician Assistant

## 2012-09-13 ENCOUNTER — Telehealth: Payer: Self-pay | Admitting: *Deleted

## 2012-09-13 NOTE — Telephone Encounter (Signed)
pt notified about all test results w/verbal understanding today

## 2012-09-13 NOTE — Telephone Encounter (Signed)
Message copied by Tarri Fuller on Fri Sep 13, 2012  9:27 AM ------      Message from: [place], [place] T      Created: Fri Sep 13, 2012  7:29 AM       Please inform patient stress test normal.      Tereso Newcomer, PA-C  7:29 AM 09/13/2012

## 2012-10-23 ENCOUNTER — Encounter: Payer: Self-pay | Admitting: Cardiology

## 2012-10-23 ENCOUNTER — Other Ambulatory Visit (INDEPENDENT_AMBULATORY_CARE_PROVIDER_SITE_OTHER): Payer: Medicare Other

## 2012-10-23 ENCOUNTER — Ambulatory Visit (INDEPENDENT_AMBULATORY_CARE_PROVIDER_SITE_OTHER): Payer: Medicare Other | Admitting: Cardiology

## 2012-10-23 VITALS — BP 122/66 | HR 68 | Ht 70.0 in | Wt 171.0 lb

## 2012-10-23 DIAGNOSIS — I25810 Atherosclerosis of coronary artery bypass graft(s) without angina pectoris: Secondary | ICD-10-CM

## 2012-10-23 DIAGNOSIS — I2510 Atherosclerotic heart disease of native coronary artery without angina pectoris: Secondary | ICD-10-CM | POA: Insufficient documentation

## 2012-10-23 DIAGNOSIS — L02416 Cutaneous abscess of left lower limb: Secondary | ICD-10-CM

## 2012-10-23 DIAGNOSIS — L02419 Cutaneous abscess of limb, unspecified: Secondary | ICD-10-CM

## 2012-10-23 DIAGNOSIS — E785 Hyperlipidemia, unspecified: Secondary | ICD-10-CM

## 2012-10-23 DIAGNOSIS — R0989 Other specified symptoms and signs involving the circulatory and respiratory systems: Secondary | ICD-10-CM

## 2012-10-23 MED ORDER — CIPROFLOXACIN HCL 500 MG PO TABS: 500.0000 mg | ORAL_TABLET | Freq: Two times a day (BID) | ORAL | Status: DC

## 2012-10-23 NOTE — Patient Instructions (Addendum)
We will check fasting lab work when convenient for you  Continue your same medication  I will see you again in 6 months.  We will start you on cipro 500 mg bid for your leg infection.

## 2012-10-23 NOTE — Progress Notes (Signed)
Chase Barrett Chase Barrett Date of Birth: 22-Sep-1945 Medical Record #[id number]  History of Present Illness: Chase Barrett is seen for followup today of coronary disease. He presented in December with unstable angina. He was found to have by 90% stenosis in the mid RCA that was treated with a drug-eluting stent. He had an 80% stenosis in the distal LAD. On followup he was noted to have atypical chest pain. He had a stress Myoview study which was normal. Ejection fraction was 60%. Today he is doing well. He is able to walk up 4 flights of stairs without difficulty. He is walking 2-1/2 miles per day. He occasionally gets a slight twinge of chest pain but not like his prior anginal pain. He denies any dyspnea. Initially had some fatigue but this has resolved.  Current Outpatient Prescriptions on File Prior to Visit  Medication Sig Dispense Refill  . alfuzosin (UROXATRAL) 10 MG 24 hr tablet Take 10 mg by mouth daily.        Marland Kitchen aspirin EC 81 MG EC tablet Take 1 tablet (81 mg total) by mouth daily.      Marland Kitchen atorvastatin (LIPITOR) 40 MG tablet Take 1 tablet (40 mg total) by mouth daily.  30 tablet  6  . clopidogrel (PLAVIX) 75 MG tablet Take 1 tablet (75 mg total) by mouth daily with breakfast.  30 tablet  11  . metoprolol succinate (TOPROL-XL) 25 MG 24 hr tablet Take 1 tablet (25 mg total) by mouth daily.  30 tablet  6  . Multiple Vitamin (MULTIVITAMIN WITH MINERALS) TABS Take 1 tablet by mouth daily.      . nitroGLYCERIN (NITROSTAT) 0.4 MG SL tablet Place 1 tablet (0.4 mg total) under the tongue every 5 (five) minutes as needed for chest pain (up to 3 doses). WARNING: Do not take this medicine if you have taken Cialis (Tadalafil) within the last 24 hours.  25 tablet  4  . tadalafil (CIALIS) 20 MG tablet Take 1 tablet (20 mg total) by mouth daily as needed. WARNING: Do not take this medicine if you have taken Nitroglycerin within the last 24 hours.        No Known Allergies  Past Medical History  Diagnosis Date   . BPH (benign prostatic hyperplasia)     s/p TURP  . Arthritis   . Diverticulosis   . Hepatic cyst   . Hepatic hemangioma   . Hemorrhoids   . DJD (degenerative joint disease)   . CAD (coronary artery disease)     a. s/p DES to RCA 08/23/12, residual moderate LAD dz for med rx (Dr. Swaziland suggested to consider stress test later to assess LAD on med rx).;   b. ETT-MV 08/2012:  EF 60%, no ischemia    . Hyperlipidemia   . PAT (paroxysmal atrial tachycardia)     Noted 08/2012 - BB started  . Anemia     Noted 08/2012  . Thrombocytopenia     Noted 08/2012  . Hematuria     Workup 2012 - no recent hematuria, reported cystoscopy last year unremarkable (short urethra, no major bladder lesions)  . DJD (degenerative joint disease)     Past Surgical History  Procedure Date  . Back surgery     x5  . Cholecystectomy   . Shoulder arthroscopy   . Transurethral resection of prostate     History  Smoking status  . Never Smoker   Smokeless tobacco  . Never Used    History  Alcohol  Use  . Yes    Comment: 2 cocktails 2-3 times per week    Family History  Problem Relation Age of Onset  . Lung cancer Mother   . Prostate cancer Father   . Stroke Sister     Review of Systems: The review of systems is positive for an infection on his left leg. He states he started with a rash with some small pustules but now has a painful area of inflammation the anterior left lower leg. All other systems were reviewed and are negative.  Physical Exam: BP 122/66  Pulse 68  Ht 5\' 10"  (1.778 m)  Wt 171 lb (77.565 kg)  BMI 24.54 kg/m2  SpO2 98% He is a pleasant white male in no acute distress. HEENT: Normal. Normocephalic, atraumatic. Pupils equal round and reactive. Oropharynx is clear. Neck is without JVD, adenopathy, thyromegaly, or bruits. Lungs: Clear Cardiovascular: Regular rate and rhythm, normal S1 and S2, no gallop or murmur. Abdomen: Soft and nontender. No masses or bruits. Extremities:  Good pedal pulses. No edema. He has a localized area of cellulitis on his left anterior shin. This is tender and red. There is a central area of skin breakdown. No drainage. No fluctuance. Neuro: Alert and oriented x3. Cranial nerves II through XII are intact. LABORATORY DATA:   Assessment / Plan: 1. Coronary disease status post stenting of the mid RCA with a drug-eluting stent on 08/23/2012. Recommend dual antiplatelet therapy for one year. We will continue metoprolol and statin therapy. Followup stress Myoview study was normal showing no evidence of ischemia in the distal LAD territory. I'll followup again in 6 months.  2. Cellulitis left lower leg. We'll start Cipro 250 mg twice a day.  3. Hyperlipidemia. We will check followup fasting lipid panel and liver function studies on Lipitor.

## 2012-10-25 ENCOUNTER — Other Ambulatory Visit: Payer: Medicare Other

## 2012-10-28 ENCOUNTER — Telehealth: Payer: Self-pay | Admitting: Cardiology

## 2012-10-28 NOTE — Telephone Encounter (Signed)
Forward  9 pages from [facility] to Dr. Peter Swaziland for review on 10-28-12 ym

## 2012-11-04 ENCOUNTER — Encounter: Payer: Self-pay | Admitting: Nurse Practitioner

## 2012-11-04 ENCOUNTER — Encounter: Payer: Self-pay | Admitting: Physician Assistant

## 2012-11-12 ENCOUNTER — Ambulatory Visit: Payer: Medicare Other | Admitting: Nurse Practitioner

## 2012-12-20 ENCOUNTER — Other Ambulatory Visit: Payer: Self-pay | Admitting: Neurosurgery

## 2012-12-20 DIAGNOSIS — M47816 Spondylosis without myelopathy or radiculopathy, lumbar region: Secondary | ICD-10-CM

## 2013-01-01 ENCOUNTER — Other Ambulatory Visit: Payer: Self-pay | Admitting: *Deleted

## 2013-01-01 DIAGNOSIS — D508 Other iron deficiency anemias: Secondary | ICD-10-CM

## 2013-01-13 ENCOUNTER — Ambulatory Visit (AMBULATORY_SURGERY_CENTER): Payer: Medicare Other | Admitting: *Deleted

## 2013-01-13 VITALS — Ht 71.0 in | Wt 168.0 lb

## 2013-01-13 DIAGNOSIS — D508 Other iron deficiency anemias: Secondary | ICD-10-CM

## 2013-01-13 DIAGNOSIS — Z1211 Encounter for screening for malignant neoplasm of colon: Secondary | ICD-10-CM

## 2013-01-13 MED ORDER — MOVIPREP 100 G PO SOLR: ORAL | Status: DC

## 2013-01-13 NOTE — Progress Notes (Signed)
Emmi information given to patient.

## 2013-01-15 ENCOUNTER — Other Ambulatory Visit: Payer: Self-pay | Admitting: Internal Medicine

## 2013-01-15 ENCOUNTER — Ambulatory Visit (AMBULATORY_SURGERY_CENTER): Payer: Medicare Other | Admitting: Internal Medicine

## 2013-01-15 ENCOUNTER — Telehealth: Payer: Self-pay | Admitting: *Deleted

## 2013-01-15 ENCOUNTER — Encounter: Payer: Self-pay | Admitting: Internal Medicine

## 2013-01-15 ENCOUNTER — Other Ambulatory Visit (INDEPENDENT_AMBULATORY_CARE_PROVIDER_SITE_OTHER): Payer: Medicare Other

## 2013-01-15 VITALS — BP 106/59 | HR 42 | Temp 96.7°F | Resp 23 | Ht 71.0 in | Wt 168.0 lb

## 2013-01-15 DIAGNOSIS — D649 Anemia, unspecified: Secondary | ICD-10-CM

## 2013-01-15 DIAGNOSIS — D689 Coagulation defect, unspecified: Secondary | ICD-10-CM

## 2013-01-15 DIAGNOSIS — D508 Other iron deficiency anemias: Secondary | ICD-10-CM

## 2013-01-15 DIAGNOSIS — Z1211 Encounter for screening for malignant neoplasm of colon: Secondary | ICD-10-CM

## 2013-01-15 DIAGNOSIS — D1330 Benign neoplasm of unspecified part of small intestine: Secondary | ICD-10-CM

## 2013-01-15 DIAGNOSIS — K319 Disease of stomach and duodenum, unspecified: Secondary | ICD-10-CM

## 2013-01-15 LAB — CBC WITH DIFFERENTIAL/PLATELET
Eosinophils Relative: 1.9 % (ref 0.0–5.0)
HCT: 41.3 % (ref 39.0–52.0)
Hemoglobin: 14.1 g/dL (ref 13.0–17.0)
Lymphs Abs: 0.7 10*3/uL (ref 0.7–4.0)
Monocytes Relative: 7.4 % (ref 3.0–12.0)
Platelets: 139 10*3/uL — ABNORMAL LOW (ref 150.0–400.0)
WBC: 6.5 10*3/uL (ref 4.5–10.5)

## 2013-01-15 MED ORDER — SODIUM CHLORIDE 0.9 % IV SOLN: 500.0000 mL | INTRAVENOUS | Status: DC

## 2013-01-15 NOTE — Op Note (Signed)
[facility] [address] [place], [address]   ENDOSCOPY PROCEDURE REPORT  PATIENT: Chase, Barrett  MR#: [id number] BIRTHDATE: Aug 05, 1946 , 66  yrs. old GENDER: Male ENDOSCOPIST: Hart Carwin, MD REFERRED BY:  Selinda Flavin, M.D. , Dr Peter Swaziland PROCEDURE DATE:  01/15/2013 PROCEDURE:  EGD w/ biopsy ASA CLASS:     Class III INDICATIONS:  Anemia.  ,Hgb 12.4, responing to Iron, pt is on Plavix and ASA MEDICATIONS: MAC sedation, administered by CRNA and propofol (Diprivan) 200mg  IV TOPICAL ANESTHETIC: none  DESCRIPTION OF PROCEDURE: After the risks benefits and alternatives of the procedure were thoroughly explained, informed consent was obtained.  The LB GIF-H180 [id number] endoscope was introduced through the mouth and advanced to the second portion of the duodenum. Without limitations.  The instrument was slowly withdrawn as the mucosa was fully examined.      Esophagus: [   The scope passed under direct vision through the posterior pharynx into esophagus. Esophageal mucosa appeared normal in the proximal and mid and distal esophagus. Z line was unremarkable. There was no stricture or esophagitis  Stomach was insufflated with air and showed mild nonspecific gastritis in gastric antrum. Biopsies were taken to rule out H. pylori. Gastric outlet was unremarkable. Retroflexion of the endoscope showed normal fundus and cardia.  Duodenum duodenal bulb and descending duodenum was normal. Multiple biopsies were taken from the descending duodenum to rule out villous atrophyen withdrawn from the patient and the procedure completed.  COMPLICATIONS: There were no complications. ENDOSCOPIC IMPRESSION: essentially normal upper endoscopy of the esophagus stomach and duodenum with minimal antral gastritis. Nothing to account for anemia. Status post biopsies from gastric antrum and from the descending duodenum RECOMMENDATIONS: 1.  Await pathology results 2.    colonoscopy today  REPEAT EXAM: no recall  eSigned:  Hart Carwin, MD 01/15/2013 9:35 AM   CC:

## 2013-01-15 NOTE — Progress Notes (Signed)
Pt discharge to the lab for blood work on discharge. Maw  Patient did not experience any of the following events: a burn prior to discharge; a fall within the facility; wrong site/side/patient/procedure/implant event; or a hospital transfer or hospital admission upon discharge from the facility. (G8907)Patient did not have preoperative order for IV antibiotic SSI prophylaxis. [phone])

## 2013-01-15 NOTE — Op Note (Signed)
[facility] [address] [place], [address]   COLONOSCOPY PROCEDURE REPORT  PATIENT: Chase Barrett, Chase Barrett  MR#: [id number] BIRTHDATE: 1946/09/14 , 66  yrs. old GENDER: Male ENDOSCOPIST: Hart Carwin, MD REFERRED BY:  Selinda Flavin, M.D. , Dr P. Swaziland PROCEDURE DATE:  01/15/2013 PROCEDURE:   Colonoscopy, screening ASA CLASS:   Class III INDICATIONS:Average risk patient for colon cancer and last colonoscopy 2003- hemorrhoids, pt is on Plavix, Hgb 12.4. MEDICATIONS: MAC sedation, administered by CRNA and propofol (Diprivan) 100mg  IV  DESCRIPTION OF PROCEDURE:   After the risks and benefits and of the procedure were explained, informed consent was obtained.  A digital rectal exam revealed no abnormalities of the rectum.    The LB [id number]  endoscope was introduced through the anus and advanced to the cecum, which was identified by both the appendix and ileocecal valve .  The quality of the prep was good, using MoviPrep .  The instrument was then slowly withdrawn as the colon was fully examined.     COLON FINDINGS: Mild diverticulosis was noted throughout the entire examined colon.   Small internal hemorrhoids were found. Retroflexed views revealed no abnormalities.     The scope was then withdrawn from the patient and the procedure completed.  COMPLICATIONS: There were no complications. ENDOSCOPIC IMPRESSION: 1.   Mild diverticulosis was noted throughout the entire examined colon 2.   Small internal hemorrhoids  RECOMMENDATIONS: High fiber diet continue Plavix and ASA monitor Hgb, Iron levels- resume Iron supplements   REPEAT EXAM: In 10 year(s)  for Colonoscopy.  cc:  _______________________________ eSignedHart Carwin, MD 01/15/2013 9:41 AM     PATIENT NAME:  Chase Barrett, Chase Barrett MR#: [id number]

## 2013-01-15 NOTE — Telephone Encounter (Signed)
Advised patient of normal CBC, hemoglobin. Patient verbalizes understanding.

## 2013-01-15 NOTE — Progress Notes (Signed)
No complaints noted in the recovery room. Maw   

## 2013-01-15 NOTE — Patient Instructions (Addendum)
Handouts were given to your care partner on diverticulosis, hemorrhoids, high fiber diet and gastritis.  Per Dr. Juanda Chance continue plavix and aspirin along with your other current medications today.  Please call if any questions or concerns.    YOU HAD AN ENDOSCOPIC PROCEDURE TODAY AT THE [facility]: Refer to the procedure report that was given to you for any specific questions about what was found during the examination.  If the procedure report does not answer your questions, please call your gastroenterologist to clarify.  If you requested that your care partner not be given the details of your procedure findings, then the procedure report has been included in a sealed envelope for you to review at your convenience later.  YOU SHOULD EXPECT: Some feelings of bloating in the abdomen. Passage of more gas than usual.  Walking can help get rid of the air that was put into your GI tract during the procedure and reduce the bloating. If you had a lower endoscopy (such as a colonoscopy or flexible sigmoidoscopy) you may notice spotting of blood in your stool or on the toilet paper. If you underwent a bowel prep for your procedure, then you may not have a normal bowel movement for a few days.  DIET: Your first meal following the procedure should be a light meal and then it is ok to progress to your normal diet.  A half-sandwich or bowl of soup is an example of a good first meal.  Heavy or fried foods are harder to digest and may make you feel nauseous or bloated.  Likewise meals heavy in dairy and vegetables can cause extra gas to form and this can also increase the bloating.  Drink plenty of fluids but you should avoid alcoholic beverages for 24 hours.  ACTIVITY: Your care partner should take you home directly after the procedure.  You should plan to take it easy, moving slowly for the rest of the day.  You can resume normal activity the day after the procedure however you should NOT DRIVE or use  heavy machinery for 24 hours (because of the sedation medicines used during the test).    SYMPTOMS TO REPORT IMMEDIATELY: A gastroenterologist can be reached at any hour.  During normal business hours, 8:30 AM to 5:00 PM Monday through Friday, call [phone].  After hours and on weekends, please call the GI answering service at [phone] who will take a message and have the physician on call contact you.   Following lower endoscopy (colonoscopy or flexible sigmoidoscopy):  Excessive amounts of blood in the stool  Significant tenderness or worsening of abdominal pains  Swelling of the abdomen that is new, acute  Fever of 100F or higher  Following upper endoscopy (EGD)  Vomiting of blood or coffee ground material  New chest pain or pain under the shoulder blades  Painful or persistently difficult swallowing  New shortness of breath  Fever of 100F or higher  Black, tarry-looking stools  FOLLOW UP: If any biopsies were taken you will be contacted by phone or by letter within the next 1-3 weeks.  Call your gastroenterologist if you have not heard about the biopsies in 3 weeks.  Our staff will call the home number listed on your records the next business day following your procedure to check on you and address any questions or concerns that you may have at that time regarding the information given to you following your procedure. This is a courtesy call and so if  there is no answer at the home number and we have not heard from you through the emergency physician on call, we will assume that you have returned to your regular daily activities without incident.  SIGNATURES/CONFIDENTIALITY: You and/or your care partner have signed paperwork which will be entered into your electronic medical record.  These signatures attest to the fact that that the information above on your After Visit Summary has been reviewed and is understood.  Full responsibility of the confidentiality of this  discharge information lies with you and/or your care-partner.

## 2013-01-16 ENCOUNTER — Telehealth: Payer: Self-pay

## 2013-01-16 NOTE — Telephone Encounter (Signed)
Left message on answering machine. 

## 2013-01-17 ENCOUNTER — Telehealth: Payer: Self-pay

## 2013-01-17 NOTE — Telephone Encounter (Signed)
Message copied by Chrystie Nose on Fri Jan 17, 2013  3:00 PM ------      Message from: Hart Carwin      Created: Thu Jan 16, 2013 10:56 PM      Regarding: SBCE       Sheri, please call the pt, I have received the CT scan results from the urologist and there is a ?? Abnormality in the small bowl. I have mentioned to him possibility of SBCE.to look for a bleeding lesion in the small bowl. Please go ahead and schedule SBCE. I will fill the form. His Hgb is much improved. He can continue Plavix,ASA ------

## 2013-01-17 NOTE — Telephone Encounter (Signed)
Pt scheduled to come for capsule teaching 01/22/13@2pm . Pt states he wants to wait to schedule the capsule when he comes on Wed. Paperwork to Dr. Juanda Chance to fill out.

## 2013-01-20 ENCOUNTER — Encounter: Payer: Self-pay | Admitting: Internal Medicine

## 2013-01-20 ENCOUNTER — Other Ambulatory Visit: Payer: Self-pay | Admitting: *Deleted

## 2013-01-20 MED ORDER — ATORVASTATIN CALCIUM 40 MG PO TABS: 40.0000 mg | ORAL_TABLET | Freq: Every day | ORAL | Status: DC

## 2013-01-20 MED ORDER — METOPROLOL SUCCINATE ER 25 MG PO TB24: 25.0000 mg | ORAL_TABLET | Freq: Every day | ORAL | Status: DC

## 2013-01-22 ENCOUNTER — Telehealth: Payer: Self-pay | Admitting: *Deleted

## 2013-01-22 DIAGNOSIS — D500 Iron deficiency anemia secondary to blood loss (chronic): Secondary | ICD-10-CM

## 2013-01-22 NOTE — Telephone Encounter (Signed)
Patient arrived for SBCE teaching. Patient given written and verbal instructions. Patient verbalizes understanding. Scheduled SBCE on 01/30/13.

## 2013-01-23 ENCOUNTER — Other Ambulatory Visit: Payer: Self-pay

## 2013-01-23 MED ORDER — METOPROLOL SUCCINATE ER 25 MG PO TB24: 25.0000 mg | ORAL_TABLET | Freq: Every day | ORAL | Status: DC

## 2013-01-30 ENCOUNTER — Ambulatory Visit (INDEPENDENT_AMBULATORY_CARE_PROVIDER_SITE_OTHER): Payer: Medicare Other | Admitting: Internal Medicine

## 2013-01-30 DIAGNOSIS — D509 Iron deficiency anemia, unspecified: Secondary | ICD-10-CM

## 2013-01-30 DIAGNOSIS — R933 Abnormal findings on diagnostic imaging of other parts of digestive tract: Secondary | ICD-10-CM

## 2013-01-30 NOTE — Progress Notes (Signed)
Patient here today for capsule endo.  He tolerated well and verbalized understanding of all written and oral instructions.    Capsule ID [id number] exp 03/2014

## 2013-02-12 ENCOUNTER — Encounter: Payer: Self-pay | Admitting: *Deleted

## 2013-03-04 ENCOUNTER — Encounter: Payer: Self-pay | Admitting: Internal Medicine

## 2013-03-04 ENCOUNTER — Other Ambulatory Visit (INDEPENDENT_AMBULATORY_CARE_PROVIDER_SITE_OTHER): Payer: Managed Care, Other (non HMO)

## 2013-03-04 ENCOUNTER — Ambulatory Visit (INDEPENDENT_AMBULATORY_CARE_PROVIDER_SITE_OTHER): Payer: Managed Care, Other (non HMO) | Admitting: Internal Medicine

## 2013-03-04 VITALS — BP 110/60 | HR 52 | Ht 69.75 in | Wt 164.1 lb

## 2013-03-04 DIAGNOSIS — K5090 Crohn's disease, unspecified, without complications: Secondary | ICD-10-CM

## 2013-03-04 LAB — IBC PANEL
Iron: 104 ug/dL (ref 42–165)
Saturation Ratios: 31.6 % (ref 20.0–50.0)
Transferrin: 235.2 mg/dL (ref 212.0–360.0)

## 2013-03-04 LAB — SEDIMENTATION RATE: Sed Rate: 5 mm/hr (ref 0–22)

## 2013-03-04 MED ORDER — BUDESONIDE 3 MG PO CP24: ORAL_CAPSULE | ORAL | Status: DC

## 2013-03-04 NOTE — Progress Notes (Signed)
Liban Guedes 10-20-1945 MRN [id number]        History of Present Illness:  This is a 67 year old white male with new diagnosis of Crohn's ileitis. The diagnosis was made on the abnormal CT scan of the abdomen in October 2012 which was done for hematuria and showed thickening of the small bowel loops in the pelvis, hepatic cyst, post cholecystectomy state. His colonoscopy in May 2014 was normal .He presented with iron deficiency anemia of hemoglobin less than 10 last fall and responded to iron supplements, last Hgb was 14.1.. He denies any GI symptoms. He denies diarrhea or rectal bleeding or abdominal pain. His weight has been stable. Upper endoscopy and small bowel biopsies were negative. Small bowel capsule endoscopy on 01/30/2013 was an incomplete study. Capsule did not reach the cecum but there was a long segment of abnormal mucosa at 3 hours consisting of ulcerations, edema and erythema consistent with Crohn's disease. There is no family history of inflammatory bowel disease. He is currently on Plavix for coronary artery disease, followed by Dr. Sw[place]iland   Past Medical History  Diagnosis Date  . BPH (benign prostatic hyperplasia)     s/p TURP  . Arthritis   . Diverticulosis   . Hepatic cyst   . Hepatic hemangioma   . Hemorrhoids   . DJD (degenerative joint disease)   . CAD (coronary artery disease)     a. s/p DES to RCA 08/23/12, residual moderate LAD dz for med rx (Dr. Swaziland suggested to consider stress test later to assess LAD on med rx).;   b. ETT-MV 08/2012:  EF 60%, no ischemia    . Hyperlipidemia   . PAT (paroxysmal atrial tachycardia)     Noted 08/2012 - BB started  . Anemia     Noted 08/2012  . Thrombocytopenia     Noted 08/2012  . Hematuria     Workup 2012 - no recent hematuria, reported cystoscopy last year unremarkable (short urethra, no major bladder lesions)  . DJD (degenerative joint disease)   . Crohn's disease    Past Surgical History  Procedure  Laterality Date  . Back surgery      x5  . Cholecystectomy    . Shoulder arthroscopy    . Transurethral resection of prostate    . Cardiac catheterization      with stent placement    reports that he has never smoked. He has never used smokeless tobacco. He reports that  drinks alcohol. He reports that he does not use illicit drugs. family history includes Lung cancer in his mother; Prostate cancer in his father; and Stroke in his sister.  There is no history of Colon cancer. No Known Allergies      Review of Systems: Denies acid reflux. Abdominal pain diarrhea  The remainder of the 10 point ROS is negative except as outlined in H&P   Physical Exam: General appearance  Well developed, in no distress. Eyes- non icteric. HEENT nontraumatic, normocephalic. Mouth no lesions, tongue papillated, no cheilosis. Neck supple without adenopathy, thyroid not enlarged, no carotid bruits, no JVD. Lungs Clear to auscultation bilaterally. Cor normal S1, normal S2, regular rhythm, no murmur,  quiet precordium. Abdomen:  Soft nontender. Scaphoid. Normal active bowel sounds. No palpable mass Rectal: Not done Extremities no pedal edema. Skin no lesions. Neurological alert and oriented x 3. Psychological normal mood and affect.  Assessment and Plan:  67 year old white male with new diagnosis of  inflammatory bowel disease involving segment of distal  ileum demonstrated on small bowel capsule endoscopy on 01/30/2013. He is asymptomatic. Only symptom was anemia, hgb dropped below 9.0 gm last fsll. We had a long discussion about Crohn's disease. He has no obstructive symptoms or any extragastrointestinal manifestations such as liver disease aphthous stomatitis or sacroiliitis.Marland Kitchen we will obtain IBD markers, sedimentation rate, iron studies B12. He we will start Entocort 9 mg daily for 8 weeks then now 6 mg for 4 weeks and 3 mg for 4 weeks. I will see him in 8 weeks. In the future he will have to be followed  by CBCs as well as a repeat small bowel capsule endoscopy or CT scan.. I will consider using immunomodulators or biologicals in the future   03/04/2013 Lina Sar

## 2013-03-04 NOTE — Patient Instructions (Addendum)
Your physician has requested that you go to the basement for lab work before leaving today.  We have sent the following medications to your pharmacy for you to pick up at your convenience: Entocort to take 3 tablets by mouth once daily x 8 weeks, then reduce to 2 tablets by mouth once daily x 4 weeks, then reduce to one tablet by mouth once daily x 4 weeks. We gave you samples to get started.  Dr Selinda Flavin, Dr. Peter Swaziland

## 2013-03-06 LAB — IBD EXPANDED PANEL
ACCA: 15 units (ref 0–90)
AMCA: 39 units (ref 0–100)
gASCA: 4 units (ref 0–50)

## 2013-03-13 ENCOUNTER — Other Ambulatory Visit: Payer: Self-pay | Admitting: Dermatology

## 2013-03-17 ENCOUNTER — Encounter (HOSPITAL_COMMUNITY): Payer: Self-pay | Admitting: Emergency Medicine

## 2013-03-17 ENCOUNTER — Emergency Department (HOSPITAL_COMMUNITY)
Admission: EM | Admit: 2013-03-17 | Discharge: 2013-03-17 | Disposition: A | Discharge status: Home / Self Care | Payer: Managed Care, Other (non HMO) | Attending: Emergency Medicine | Admitting: Emergency Medicine

## 2013-03-17 ENCOUNTER — Telehealth: Payer: Self-pay | Admitting: Cardiology

## 2013-03-17 ENCOUNTER — Emergency Department (HOSPITAL_COMMUNITY): Payer: Managed Care, Other (non HMO)

## 2013-03-17 DIAGNOSIS — D696 Thrombocytopenia, unspecified: Secondary | ICD-10-CM | POA: Insufficient documentation

## 2013-03-17 DIAGNOSIS — Z9889 Other specified postprocedural states: Secondary | ICD-10-CM | POA: Insufficient documentation

## 2013-03-17 DIAGNOSIS — Z87448 Personal history of other diseases of urinary system: Secondary | ICD-10-CM | POA: Insufficient documentation

## 2013-03-17 DIAGNOSIS — Z8679 Personal history of other diseases of the circulatory system: Secondary | ICD-10-CM | POA: Insufficient documentation

## 2013-03-17 DIAGNOSIS — R079 Chest pain, unspecified: Secondary | ICD-10-CM | POA: Insufficient documentation

## 2013-03-17 DIAGNOSIS — Z7902 Long term (current) use of antithrombotics/antiplatelets: Secondary | ICD-10-CM | POA: Insufficient documentation

## 2013-03-17 DIAGNOSIS — Z79899 Other long term (current) drug therapy: Secondary | ICD-10-CM | POA: Insufficient documentation

## 2013-03-17 DIAGNOSIS — M1990 Unspecified osteoarthritis, unspecified site: Secondary | ICD-10-CM | POA: Insufficient documentation

## 2013-03-17 DIAGNOSIS — E785 Hyperlipidemia, unspecified: Secondary | ICD-10-CM | POA: Insufficient documentation

## 2013-03-17 DIAGNOSIS — R001 Bradycardia, unspecified: Secondary | ICD-10-CM

## 2013-03-17 DIAGNOSIS — Z8719 Personal history of other diseases of the digestive system: Secondary | ICD-10-CM | POA: Insufficient documentation

## 2013-03-17 DIAGNOSIS — Z7982 Long term (current) use of aspirin: Secondary | ICD-10-CM | POA: Insufficient documentation

## 2013-03-17 DIAGNOSIS — I498 Other specified cardiac arrhythmias: Secondary | ICD-10-CM | POA: Insufficient documentation

## 2013-03-17 DIAGNOSIS — D649 Anemia, unspecified: Secondary | ICD-10-CM | POA: Insufficient documentation

## 2013-03-17 DIAGNOSIS — M129 Arthropathy, unspecified: Secondary | ICD-10-CM | POA: Insufficient documentation

## 2013-03-17 DIAGNOSIS — I2510 Atherosclerotic heart disease of native coronary artery without angina pectoris: Secondary | ICD-10-CM | POA: Insufficient documentation

## 2013-03-17 DIAGNOSIS — J90 Pleural effusion, not elsewhere classified: Secondary | ICD-10-CM | POA: Insufficient documentation

## 2013-03-17 LAB — PROTIME-INR
INR: 1.05 (ref 0.00–1.49)
Prothrombin Time: 13.5 seconds (ref 11.6–15.2)

## 2013-03-17 LAB — BASIC METABOLIC PANEL
GFR calc Af Amer: >90 mL/min (ref >90)
GFR calc non Af Amer: 89 mL/min — ABNORMAL LOW (ref >90)
Glucose, Bld: 97 mg/dL (ref 70–99)
Potassium: 4.4 mEq/L (ref 3.5–5.1)
Sodium: 139 mEq/L (ref 135–145)

## 2013-03-17 LAB — CBC
Hemoglobin: 14.5 g/dL (ref 13.0–17.0)
MCHC: 34.7 g/dL (ref 30.0–36.0)
Platelets: 134 10*3/uL — ABNORMAL LOW (ref 150–400)
RDW: 15.9 % — ABNORMAL HIGH (ref 11.5–15.5)

## 2013-03-17 LAB — APTT: aPTT: 29 seconds (ref 24–37)

## 2013-03-17 MED ORDER — ASPIRIN 81 MG PO CHEW
243.0000 mg | CHEWABLE_TABLET | Freq: Once | ORAL | Status: AC
  Administered 2013-03-17: 243 mg via ORAL

## 2013-03-17 MED ORDER — NITROGLYCERIN 0.4 MG SL SUBL
0.4000 mg | SUBLINGUAL_TABLET | SUBLINGUAL | Status: DC | PRN
  Administered 2013-03-17: 0.4 mg via SUBLINGUAL
  Filled 2013-03-17: qty 25

## 2013-03-17 MED ORDER — ASPIRIN 81 MG PO CHEW
324.0000 mg | CHEWABLE_TABLET | Freq: Once | ORAL | Status: DC
  Filled 2013-03-17: qty 3

## 2013-03-17 NOTE — Telephone Encounter (Signed)
Received call from patient he stated he is having chest pain at present.Stated he has been having chest pain on and off all weekend.Advised to go to [facility] ER.Mardella Layman called and told.

## 2013-03-17 NOTE — ED Notes (Signed)
Pt discharged.Vital signs stable and GCS 15 

## 2013-03-17 NOTE — ED Notes (Signed)
zoll at bedside  

## 2013-03-17 NOTE — ED Notes (Signed)
Pt sts he took 1 baby ASA today, 81 mg.

## 2013-03-17 NOTE — ED Provider Notes (Signed)
History    CSN: [id number] Arrival date & time 03/17/13  1230  First MD Initiated Contact with Patient 03/17/13 1352     Chief Complaint  Patient presents with  . Chest Pain   (Consider location/radiation/quality/duration/timing/severity/associated sxs/prior Treatment) Patient is a 67 y.o. male presenting with chest pain.  Chest Pain  Pt with history of CAD had DES placed in RCA Dec 2013 and also found to have moderate non-obstructive disease in LAD then. He reports an episode of CP, dizziness and SOB 2 nights ago resolved with Tramadol and rest. He was in his normal state of health yesterday including exercising without pain, but began to have chest pain again this morning, L upper chest pain, radiating into his L arm and associated with SOB. He was advised by [facility] office to come to the ED. He was having 5/10 pain on my evaluation, resolved after NTG x 1 and no recurrence since then.   Past Medical History  Diagnosis Date  . BPH (benign prostatic hyperplasia)     s/p TURP  . Arthritis   . Diverticulosis   . Hepatic cyst   . Hepatic hemangioma   . Hemorrhoids   . DJD (degenerative joint disease)   . CAD (coronary artery disease)     a. s/p DES to RCA 08/23/12, residual moderate LAD dz for med rx (Dr. Swaziland suggested to consider stress test later to assess LAD on med rx).;   b. ETT-MV 08/2012:  EF 60%, no ischemia    . Hyperlipidemia   . PAT (paroxysmal atrial tachycardia)     Noted 08/2012 - BB started  . Anemia     Noted 08/2012  . Thrombocytopenia     Noted 08/2012  . Hematuria     Workup 2012 - no recent hematuria, reported cystoscopy last year unremarkable (short urethra, no major bladder lesions)  . DJD (degenerative joint disease)   . Crohn's disease    Past Surgical History  Procedure Laterality Date  . Back surgery      x5  . Cholecystectomy    . Shoulder arthroscopy    . Transurethral resection of prostate    . Cardiac catheterization      with  stent placement   Family History  Problem Relation Age of Onset  . Lung cancer Mother   . Prostate cancer Father   . Stroke Sister   . Colon cancer Neg Hx    History  Substance Use Topics  . Smoking status: Never Smoker   . Smokeless tobacco: Never Used  . Alcohol Use: Yes     Comment: 2 cocktails 2-3 times per week    Review of Systems  Cardiovascular: Positive for chest pain.   All other systems reviewed and are negative except as noted in HPI.   Allergies  Review of patient's allergies indicates no known allergies.  Home Medications   Current Outpatient Rx  Name  Route  Sig  Dispense  Refill  . alfuzosin (UROXATRAL) 10 MG 24 hr tablet   Oral   Take 10 mg by mouth daily.           Marland Kitchen aspirin EC 81 MG EC tablet   Oral   Take 1 tablet (81 mg total) by mouth daily.         Marland Kitchen atorvastatin (LIPITOR) 40 MG tablet   Oral   Take 1 tablet (40 mg total) by mouth daily.   30 tablet   6   . budesonide (  ENTOCORT EC) 3 MG 24 hr capsule   Oral   Take 9 mg by mouth daily.         . clopidogrel (PLAVIX) 75 MG tablet   Oral   Take 1 tablet (75 mg total) by mouth daily with breakfast.   30 tablet   11   . Cyanocobalamin (VITAMIN B 12 PO)   Oral   Take 1 tablet by mouth 2 (two) times daily.         . IRON PO   Oral   Take 1 tablet by mouth daily.         . metoprolol succinate (TOPROL-XL) 25 MG 24 hr tablet   Oral   Take 1 tablet (25 mg total) by mouth daily.   90 tablet   1   . nitroGLYCERIN (NITROSTAT) 0.4 MG SL tablet   Sublingual   Place 1 tablet (0.4 mg total) under the tongue every 5 (five) minutes as needed for chest pain (up to 3 doses). WARNING: Do not take this medicine if you have taken Cialis (Tadalafil) within the last 24 hours.   25 tablet   4   . tadalafil (CIALIS) 20 MG tablet   Oral   Take 1 tablet (20 mg total) by mouth daily as needed. WARNING: Do not take this medicine if you have taken Nitroglycerin within the last 24 hours.          . traMADol (ULTRAM) 50 MG tablet   Oral   Take 50 mg by mouth every 6 (six) hours as needed for pain.          . vitamin C (ASCORBIC ACID) 500 MG tablet   Oral   Take 500 mg by mouth daily.          BP 116/61  Pulse 40  Temp(Src) 97.9 F (36.6 C) (Oral)  Resp 11  SpO2 96% Physical Exam  Nursing note and vitals reviewed. Constitutional: He is oriented to person, place, and time. He appears well-developed and well-nourished.  HENT:  Head: Normocephalic and atraumatic.  Eyes: EOM are normal. Pupils are equal, round, and reactive to light.  Neck: Normal range of motion. Neck supple.  Cardiovascular: Normal heart sounds and intact distal pulses.  Bradycardia present.   Pulmonary/Chest: Effort normal and breath sounds normal.  Abdominal: Bowel sounds are normal. He exhibits no distension. There is no tenderness.  Musculoskeletal: Normal range of motion. He exhibits no edema and no tenderness.  Neurological: He is alert and oriented to person, place, and time. He has normal strength. No cranial nerve deficit or sensory deficit.  Skin: Skin is warm and dry. No rash noted.  Psychiatric: He has a normal mood and affect.    ED Course  Procedures (including critical care time) Labs Reviewed  CBC - Abnormal; Notable for the following:    RDW 15.9 (*)    Platelets 134 (*)    All other components within normal limits  BASIC METABOLIC PANEL - Abnormal; Notable for the following:    GFR calc non Af Amer 89 (*)    All other components within normal limits  PROTIME-INR  APTT  POCT I-STAT TROPONIN I   Dg Chest 2 View  03/17/2013   *RADIOLOGY REPORT*  Clinical Data: 67 year old male left axillary chest pain.  CHEST - 2 VIEW  Comparison: 08/21/2012 and earlier.  Findings: Stable and normal lung volumes.  Cardiac size and mediastinal contours are within normal limits.  Visualized tracheal air column is within  normal limits.  No pneumothorax or pulmonary edema.  Positive small left  pleural effusion.  No other acute pulmonary opacity. No acute osseous abnormality identified.  IMPRESSION: A small left pleural effusion.  No other acute cardiopulmonary abnormality.   Original Report Authenticated By: Erskine Speed, M.D.   1. Chest pain   2. Bradycardia     MDM   Date: 03/17/2013  Rate: 43  Rhythm: sinus bradycardia  QRS Axis: normal  Intervals: normal  ST/T Wave abnormalities: normal  Conduction Disutrbances:none  Narrative Interpretation:   Old EKG Reviewed: changes noted,slower rate  Labs and imaging in the ED are unremarkable. Discussed with [facility] who has seen the patient and does not feel he needs admission for either the chest pain or bradycardia. He has neg trop here several hours after symptom onset. Pt to be seen in the office for follow-up.    Nakiesha Rumsey B. Bernette Mayers, MD 03/17/13 3086

## 2013-03-17 NOTE — Consult Note (Signed)
Cardiology Consult Note   Patient ID: Chase Barrett MRN: [id number], DOB/AGE: October 01, 1945 67 y.o. Date of Encounter: 03/17/2013  Primary Physician: Selinda Flavin, MD Primary Cardiologist: PJ  Chief Complaint:  Chest pain  HPI: Chase Barrett is a 67 y.o. male with a history of CAD. He is very active and has not had any chest pain since his PCI. Mr. Caspers was in his usual state of health 2 days ago. He did Pilates in the morning and went for a 10 mile bike ride in the afternoon. He denied any exertional symptoms including shortness of breath or chest pain.   Later that night, he had onset of upper left chest/shoulder pain. It is right at the axillary crease. When he takes a deep breath, he will have some pain down his left arm. He also has some discomfort in the left side of his neck. He has never had this pain before. It is significantly different from the pain he had prior to his PCI. When the pain began, it was approximately a 4/10. He took at tramadol which helped his symptoms. He had to sleep sitting up. He denies shortness of breath. The pain decreased to a 1 or 2/10 with tramadol. However, it never resolved.   The pain continued but he was able to do his usual activities including a brisk 2-1/2 mile walk yesterday without changing his symptoms. Today, the pain was at a low level when he was at work. It became worse and he came to the emergency room. In the emergency room he received aspirin 81 mg x3 and sublingual nitroglycerin x1. The medications may have helped his pain as it is now back down to a 1 or 2/10. It is a little bit worse with deep inspiration. There is also a small amount of tenderness in the area between his acromion process and clavicle. He had SOB this am for < one minute but otherwise no SOB, N&V.   Past Medical History  Diagnosis Date  . BPH (benign prostatic hyperplasia)     s/p TURP  . Arthritis   . Diverticulosis   . Hepatic cyst   . Hepatic  hemangioma   . Hemorrhoids   . DJD (degenerative joint disease)   . CAD (coronary artery disease)     a. s/p DES to RCA 08/23/12, residual moderate LAD dz for med rx;   b. ETT-MV 08/2012 to assess LAD:  EF 60%, no ischemia    . Hyperlipidemia   . PAT (paroxysmal atrial tachycardia)     Noted 08/2012 - BB started  . Anemia     Noted 08/2012  . Thrombocytopenia     Noted 08/2012  . Hematuria     Workup 2012 - no recent hematuria, reported cystoscopy last year unremarkable (short urethra, no major bladder lesions)  . DJD (degenerative joint disease)   . Crohn's disease     Surgical History:  Past Surgical History  Procedure Laterality Date  . Back surgery      x5  . Cholecystectomy    . Shoulder arthroscopy    . Transurethral resection of prostate    . Cardiac catheterization      with stent placement     I have reviewed the patient's current medications. Medication Sig  alfuzosin (UROXATRAL) 10 MG 24 hr tablet Take 10 mg by mouth daily.    aspirin EC 81 MG EC tablet Take 1 tablet (81 mg total) by mouth daily.  atorvastatin (LIPITOR)  40 MG tablet Take 1 tablet (40 mg total) by mouth daily.  budesonide (ENTOCORT EC) 3 MG 24 hr capsule Take 9 mg by mouth daily.  clopidogrel (PLAVIX) 75 MG tablet Take 1 tablet (75 mg total) by mouth daily with breakfast.  Cyanocobalamin (VITAMIN B 12 PO) Take 1 tablet by mouth 2 (two) times daily.  IRON PO Take 1 tablet by mouth daily.  metoprolol succinate (TOPROL-XL) 25 MG 24 hr tablet Take 1 tablet (25 mg total) by mouth daily.  nitroGLYCERIN (NITROSTAT) 0.4 MG SL tablet Place 1 tablet (0.4 mg total) under the tongue every 5 (five) minutes as needed for chest pain (up to 3 doses). WARNING: Do not take this medicine if you have taken Cialis (Tadalafil) within the last 24 hours.  tadalafil (CIALIS) 20 MG tablet Take 1 tablet (20 mg total) by mouth daily as needed. WARNING: Do not take this medicine if you have taken Nitroglycerin within the last 24  hours.  traMADol (ULTRAM) 50 MG tablet Take 50 mg by mouth every 6 (six) hours as needed for pain.   vitamin C (ASCORBIC ACID) 500 MG tablet Take 500 mg by mouth daily.   Scheduled Meds:  Continuous Infusions:  PRN Meds:.nitroGLYCERIN  Allergies: No Known Allergies  History   Social History  . Marital Status: Married    Spouse Name: N/A    Number of Children: 4  . Years of Education: N/A   Occupational History  .  [employer]   Social History Main Topics  . Smoking status: Never Smoker   . Smokeless tobacco: Never Used  . Alcohol Use: Yes     Comment: 2 cocktails 2-3 times per week  . Drug Use: No  . Sexually Active: Not on file   Other Topics Concern  . Not on file   Social History Narrative  .  patient lives with wife and exercises regularly     Family History  Problem Relation Age of Onset  . Lung cancer Mother   . Prostate cancer Father   . Stroke Sister   . Colon cancer Neg Hx    Family Status  Relation Status Death Age  . Father Alive     Review of Systems: Occasional back pain. Full 14-point review of systems otherwise negative except as noted above.  Physical Exam: Blood pressure 130/96, pulse 40, temperature 97.9 F (36.6 C), temperature source Oral, resp. rate 16, SpO2 100.00%. General: Well developed, well nourished,male in no acute distress. Head: Normocephalic, atraumatic, sclera non-icteric, no xanthomas, nares are without discharge. Dentition: Good Neck: No carotid bruits. JVD not elevated. No thyromegally Lungs: Good expansion bilaterally. without wheezes or rhonchi.  Heart: Regular but slow rate and rhythm with S1 S2.  No S3 or S4.  No murmur, no rubs, or gallops appreciated. Abdomen: Soft, non-tender, non-distended with normoactive bowel sounds. No hepatomegaly. No rebound/guarding. No obvious abdominal masses. Msk:  Strength and tone appear normal for age. No joint deformities or effusions, no spine or costo-vertebral angle tenderness.  Slight tenderness at the upper left chest wall/shoulder; pain becomes worse when the arm is raised and moved forward or backward but no other direction. Extremities: No clubbing or cyanosis. No edema.  Distal pedal pulses are 2+ in 4 extrem Neuro: Alert and oriented X 3. Moves all extremities spontaneously. No focal deficits noted. Psych:  Responds to questions appropriately with a normal affect. Skin: No rashes or lesions noted  Labs:   Lab Results  Component Value Date  WBC 8.2 03/17/2013   HGB 14.5 03/17/2013   HCT 41.8 03/17/2013   MCV 85.7 03/17/2013   PLT 134* 03/17/2013    Recent Labs  03/17/13 1245  INR 1.05     Recent Labs Lab 03/17/13 1245  NA 139  K 4.4  CL 104  CO2 28  BUN 17  CREATININE 0.83  CALCIUM 8.8  GLUCOSE 97   No results found for this basename: CKTOTAL, CKMB, TROPONINI,  in the last 72 hours  Recent Labs  03/17/13 1255  TROPIPOC 0.01   Lab Results  Component Value Date   CHOL 144 08/22/2012   HDL 32* 08/22/2012   LDLCALC 90 08/22/2012   TRIG 109 08/22/2012   Radiology/Studies: Dg Chest 2 View 03/17/2013   *RADIOLOGY REPORT*  Clinical Data: 67 year old male left axillary chest pain.  CHEST - 2 VIEW  Comparison: 08/21/2012 and earlier.  Findings: Stable and normal lung volumes.  Cardiac size and mediastinal contours are within normal limits.  Visualized tracheal air column is within normal limits.  No pneumothorax or pulmonary edema.  Positive small left pleural effusion.  No other acute pulmonary opacity. No acute osseous abnormality identified.  IMPRESSION: A small left pleural effusion.  No other acute cardiopulmonary abnormality.   Original Report Authenticated By: Erskine Speed, M.D.     Cardiac Cath: 08/22/2012 Left mainstem: normal  Left anterior descending (LAD): Provides a large diagonal and septal, then has 70-80% eccentric narrowing in the proximal portion of the distal LAD  Left circumflex (LCx): Provides a large OM with 30-40% mid  narrowing. The AV circumflex has 30% distal narrowing.  Right coronary artery (RCA): large caliber vessel with 90% mid lesion that is segmental. It is slightly hazy. The distal vessel is large. It provides an acute marginal, two small PDA branches, and a very large PLA artery.  Left ventriculography: Left ventricular systolic function is normal, LVEF is estimated at 55-65%, there is no significant mitral regurgitation  Final Conclusions:  1. 2 vessel CAD with high grade RCA lesion and moderate distal LAD  2. History of hematuria.  Recommendations:  1. Clopidogrel load with PRU  2. Assess for hematuria after load  3. Staged PCI of the RCA with ?BMS vs DES, and then observation of LAD to see how he does on clopidogrel given prior hematuria. 08/23/2012 Lesion Data:  Vessel: RCA  Percent stenosis (pre): 90%  TIMI-flow (pre): 3  Stent: 3.5 x 20 mm Promus premier  Percent stenosis (post): 0%  TIMI-flow (post): 3  Echo: 08/11/2008 SUMMARY - Posible posterior lateral hypokinesis Overall left ventricular systolic function was at the lower limits of normal. Left ventricular ejection fraction was estimated to be 55 %. - The aortic valve was mildly calcified. - There was mild mitral valvular regurgitation. The effective orifice of mitral regurgitation by proximal isovelocity surface area was 0.04 cm^2. The volume of mitral regurgitation by proximal isovelocity surface area was 8 cc. - The left atrium was mildly dilated.  ECG: 17-Mar-2013 12:35:42  Marked sinus bradycardia with marked sinus arrhythmia Abnormal ECG Vent. rate 43 BPM PR interval 156 ms QRS duration 86 ms QT/QTc 492/415 ms P-R-T axes 69 74 75  ASSESSMENT AND PLAN:  Principal Problem:   Chest pain, localized - His pain is atypical for angina and not like his previous anginal symptoms. His initial enzymes are negative despite > 24 hours of pain. There was no change in his pain with exertion. His ECG is unchanged (although the  rate is  lower). MD advise on d/c from ER and f/u in office.   SignedTheodore Demark, PA-C 03/17/2013 5:08 PM Beeper [phone]  I have personally seen and examined this patient with Theodore Demark, PA-C. I agree with the assessment and plan as outlined above. His pain is atypical, likely musculoskeletal after vigorous upper body workout Saturday am. His chest pain is focal on left chest wall, worsened with deep breaths and with movement of his left arm. The pain is not similar to his angina experienced in December before his stent was placed in the RCA. He went for a brisk walk yesterday with no worsening of the pain. EKG does not show ischemic changes. Troponin negative. Will let him go home from the ED and have close f/u in our office next week. No changes in meds.   MCALHANY,CHRISTOPHER 5:30 PM 03/17/2013

## 2013-03-17 NOTE — Telephone Encounter (Signed)
New problem  Pt states he is actively experiencing chest pain as well as in his left arm. He said he also had it this weekend but he thought it would go away. He said he took some Nitro tablets this weekend but they did not seem to help.

## 2013-03-17 NOTE — ED Notes (Signed)
Pt c/o left sided CP with radiation down left arm and SOB and dizziness starting on Saturday

## 2013-03-27 ENCOUNTER — Telehealth: Payer: Self-pay | Admitting: Cardiology

## 2013-03-27 MED ORDER — ATORVASTATIN CALCIUM 40 MG PO TABS: 40.0000 mg | ORAL_TABLET | Freq: Every day | ORAL | Status: DC

## 2013-03-27 NOTE — Telephone Encounter (Signed)
Spoke with patient who called to request refill of Lipitor.  I verified patient's pharmacy with patient and reordered medication.  Patient verbalized understanding and expressed gratitude.

## 2013-03-27 NOTE — Telephone Encounter (Signed)
New Prob      Pt is requesting a new prescription of LIPITOR 3 month supply.

## 2013-03-28 ENCOUNTER — Ambulatory Visit (INDEPENDENT_AMBULATORY_CARE_PROVIDER_SITE_OTHER): Payer: Managed Care, Other (non HMO) | Admitting: Cardiology

## 2013-03-28 ENCOUNTER — Encounter: Payer: Self-pay | Admitting: Cardiology

## 2013-03-28 VITALS — BP 128/72 | HR 63 | Ht 69.75 in | Wt 161.2 lb

## 2013-03-28 DIAGNOSIS — I498 Other specified cardiac arrhythmias: Secondary | ICD-10-CM

## 2013-03-28 DIAGNOSIS — I2510 Atherosclerotic heart disease of native coronary artery without angina pectoris: Secondary | ICD-10-CM

## 2013-03-28 DIAGNOSIS — E785 Hyperlipidemia, unspecified: Secondary | ICD-10-CM

## 2013-03-28 DIAGNOSIS — R001 Bradycardia, unspecified: Secondary | ICD-10-CM

## 2013-03-28 MED ORDER — LISINOPRIL 10 MG PO TABS: 10.0000 mg | ORAL_TABLET | Freq: Every day | ORAL | Status: DC

## 2013-03-28 NOTE — Patient Instructions (Addendum)
Stop metoprolol   Start lisinopril 10 mg daily for blood pressure.  I will see you in 1 month with lab work

## 2013-03-28 NOTE — Progress Notes (Signed)
Collene Gobble III Game Date of Birth: April 10, 1946 Medical Record #[id number]  History of Present Illness: Mr. Stuard is seen for followup. He presented in December with unstable angina. He was found to have by 90% stenosis in the mid RCA that was treated with a drug-eluting stent. He had an 80% stenosis in the distal LAD. On followup he was noted to have atypical chest pain. He had a stress Myoview study in December 2013 which was normal. Ejection fraction was 60%. 2 weeks ago he experienced some atypical chest pain. He presented to the emergency department. There his evaluation was unremarkable with the exception of marked bradycardia. His heart rate was down to 36 beats per minute. His metoprolol was discontinued. Since then he has felt well without significant chest pain. He still gets occasional dull sensation in his chest that he is aware of but it is unlike his previous anginal symptoms. He is able to walk 3 miles without any difficulty. Since his Toprol was held his blood pressure has been running higher and was 151/78 this morning.  Current Outpatient Prescriptions on File Prior to Visit  Medication Sig Dispense Refill  . alfuzosin (UROXATRAL) 10 MG 24 hr tablet Take 10 mg by mouth daily.        Marland Kitchen aspirin EC 81 MG EC tablet Take 1 tablet (81 mg total) by mouth daily.      Marland Kitchen atorvastatin (LIPITOR) 40 MG tablet Take 1 tablet (40 mg total) by mouth daily.  90 tablet  3  . clopidogrel (PLAVIX) 75 MG tablet Take 1 tablet (75 mg total) by mouth daily with breakfast.  30 tablet  11  . Cyanocobalamin (VITAMIN B 12 PO) Take 1 tablet by mouth 2 (two) times daily.      . IRON PO Take 1 tablet by mouth daily.      . nitroGLYCERIN (NITROSTAT) 0.4 MG SL tablet Place 1 tablet (0.4 mg total) under the tongue every 5 (five) minutes as needed for chest pain (up to 3 doses). WARNING: Do not take this medicine if you have taken Cialis (Tadalafil) within the last 24 hours.  25 tablet  4  . tadalafil (CIALIS) 20 MG  tablet Take 1 tablet (20 mg total) by mouth daily as needed. WARNING: Do not take this medicine if you have taken Nitroglycerin within the last 24 hours.      . traMADol (ULTRAM) 50 MG tablet Take 50 mg by mouth every 6 (six) hours as needed for pain.       . vitamin C (ASCORBIC ACID) 500 MG tablet Take 500 mg by mouth daily.       No current facility-administered medications on file prior to visit.    No Known Allergies  Past Medical History  Diagnosis Date  . BPH (benign prostatic hyperplasia)     s/p TURP  . Arthritis   . Diverticulosis   . Hepatic cyst   . Hepatic hemangioma   . Hemorrhoids   . DJD (degenerative joint disease)   . CAD (coronary artery disease)     a. s/p DES to RCA 08/23/12, residual moderate LAD dz for med rx;   b. ETT-MV 08/2012 to assess LAD:  EF 60%, no ischemia    . Hyperlipidemia   . PAT (paroxysmal atrial tachycardia)     Noted 08/2012 - BB started  . Anemia     Noted 08/2012  . Thrombocytopenia     Noted 08/2012  . Hematuria     Workup  2012 - no recent hematuria, reported cystoscopy last year unremarkable (short urethra, no major bladder lesions)  . DJD (degenerative joint disease)   . Crohn's disease     Past Surgical History  Procedure Laterality Date  . Back surgery      x5  . Cholecystectomy    . Shoulder arthroscopy    . Transurethral resection of prostate    . Cardiac catheterization      with stent placement    History  Smoking status  . Never Smoker   Smokeless tobacco  . Never Used    History  Alcohol Use  . Yes    Comment: 2 cocktails 2-3 times per week    Family History  Problem Relation Age of Onset  . Lung cancer Mother   . Prostate cancer Father   . Stroke Sister   . Colon cancer Neg Hx     Review of Systems: The review of systems is positive for an infection on his left leg. He states he started with a rash with some small pustules but now has a painful area of inflammation the anterior left lower leg. All  other systems were reviewed and are negative.  Physical Exam: BP 128/72  Pulse 63  Ht 5' 9.75" (1.772 m)  Wt 161 lb 3.2 oz (73.12 kg)  BMI 23.29 kg/m2  SpO2 98% He is a pleasant white male in no acute distress. HEENT: Normal.  Oropharynx is clear. Neck is without JVD, adenopathy, thyromegaly, or bruits. Lungs: Clear Cardiovascular: Regular rate and rhythm, normal S1 and S2, no gallop or murmur. Abdomen: Soft and nontender. No masses or bruits. Extremities: Good pedal pulses. No edema. He has a localized area of cellulitis on his left anterior shin. This is tender and red. There is a central area of skin breakdown. No drainage. No fluctuance. Neuro: Alert and oriented x3. Cranial nerves II through XII are intact. LABORATORY DATA: Lab Results  Component Value Date   WBC 8.2 03/17/2013   HGB 14.5 03/17/2013   HCT 41.8 03/17/2013   PLT 134* 03/17/2013   GLUCOSE 97 03/17/2013   CHOL 144 08/22/2012   TRIG 109 08/22/2012   HDL 32* 08/22/2012   LDLCALC 90 08/22/2012   ALT 16 08/21/2012   AST 21 08/21/2012   NA 139 03/17/2013   K 4.4 03/17/2013   CL 104 03/17/2013   CREATININE 0.83 03/17/2013   BUN 17 03/17/2013   CO2 28 03/17/2013   TSH 0.791 08/21/2012   INR 1.05 03/17/2013   HGBA1C 5.3 08/21/2012   ECG in the emergency department showed marked bradycardia but no acute changes.  Assessment / Plan: 1. Coronary disease status post stenting of the mid RCA with a drug-eluting stent on 08/23/2012. Recommend dual antiplatelet therapy for one year. We will continue  and statin therapy. Followup stress Myoview study was normal showing no evidence of ischemia in the distal LAD territory. Metoprolol discontinued due to to marked bradycardia.  2. Hypertension-I recommended starting lisinopril 10 mg daily. We'll followup in one month and check a basic metabolic panel at that time.  3. Hyperlipidemia. Well controlled on Lipitor.

## 2013-04-30 ENCOUNTER — Encounter: Payer: Self-pay | Admitting: Cardiology

## 2013-04-30 ENCOUNTER — Ambulatory Visit (INDEPENDENT_AMBULATORY_CARE_PROVIDER_SITE_OTHER): Payer: Managed Care, Other (non HMO) | Admitting: Cardiology

## 2013-04-30 ENCOUNTER — Other Ambulatory Visit: Payer: Managed Care, Other (non HMO)

## 2013-04-30 DIAGNOSIS — R001 Bradycardia, unspecified: Secondary | ICD-10-CM

## 2013-04-30 DIAGNOSIS — E785 Hyperlipidemia, unspecified: Secondary | ICD-10-CM

## 2013-04-30 DIAGNOSIS — I2510 Atherosclerotic heart disease of native coronary artery without angina pectoris: Secondary | ICD-10-CM

## 2013-04-30 DIAGNOSIS — I498 Other specified cardiac arrhythmias: Secondary | ICD-10-CM

## 2013-04-30 LAB — HEPATIC FUNCTION PANEL
ALT: 23 U/L (ref 0–53)
AST: 21 U/L (ref 0–37)
Bilirubin, Direct: 0.2 mg/dL (ref 0.0–0.3)
Total Bilirubin: 1.3 mg/dL — ABNORMAL HIGH (ref 0.3–1.2)

## 2013-04-30 LAB — BASIC METABOLIC PANEL
BUN: 14 mg/dL (ref 6–23)
CO2: 29 mEq/L (ref 19–32)
Calcium: 8.5 mg/dL (ref 8.4–10.5)
GFR: 90.58 mL/min (ref >60.00)
Glucose, Bld: 93 mg/dL (ref 70–99)

## 2013-04-30 LAB — LIPID PANEL: Total CHOL/HDL Ratio: 3

## 2013-04-30 MED ORDER — LOSARTAN POTASSIUM 25 MG PO TABS: 25.0000 mg | ORAL_TABLET | Freq: Every day | ORAL | Status: DC

## 2013-04-30 NOTE — Patient Instructions (Signed)
Stop taking lisinopril.  Start losartan 25 mg daily  Continue your other therapy  We will call with the results of your lab work
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
## 2013-05-08 ENCOUNTER — Other Ambulatory Visit (INDEPENDENT_AMBULATORY_CARE_PROVIDER_SITE_OTHER): Payer: Managed Care, Other (non HMO)

## 2013-05-08 ENCOUNTER — Encounter: Payer: Self-pay | Admitting: Internal Medicine

## 2013-05-08 ENCOUNTER — Ambulatory Visit (INDEPENDENT_AMBULATORY_CARE_PROVIDER_SITE_OTHER): Payer: Managed Care, Other (non HMO) | Admitting: Internal Medicine

## 2013-05-08 ENCOUNTER — Ambulatory Visit: Payer: Managed Care, Other (non HMO) | Admitting: Internal Medicine

## 2013-05-08 VITALS — BP 118/60 | HR 76 | Ht 70.0 in | Wt 164.5 lb

## 2013-05-08 DIAGNOSIS — K5090 Crohn's disease, unspecified, without complications: Secondary | ICD-10-CM

## 2013-05-08 DIAGNOSIS — D509 Iron deficiency anemia, unspecified: Secondary | ICD-10-CM

## 2013-05-08 LAB — CBC WITH DIFFERENTIAL/PLATELET
Basophils Relative: 0.4 % (ref 0.0–3.0)
Eosinophils Absolute: 0.2 10*3/uL (ref 0.0–0.7)
Eosinophils Relative: 3.2 % (ref 0.0–5.0)
HCT: 44.2 % (ref 39.0–52.0)
Lymphs Abs: 1 10*3/uL (ref 0.7–4.0)
MCHC: 34.8 g/dL (ref 30.0–36.0)
MCV: 92.3 fl (ref 78.0–100.0)
Monocytes Absolute: 0.7 10*3/uL (ref 0.1–1.0)
Neutrophils Relative %: 74.3 % (ref 43.0–77.0)
Platelets: 159 10*3/uL (ref 150.0–400.0)
RBC: 4.79 Mil/uL (ref 4.22–5.81)

## 2013-05-08 LAB — IBC PANEL: Transferrin: 228.4 mg/dL (ref 212.0–360.0)

## 2013-05-08 NOTE — Patient Instructions (Addendum)
You have been scheduled for a follow up appointment with Dr Lina Sar on Friday, 07/11/13 @ 1:45 pm. Please go to the basement floor our Dr Regino Schultze office on Thursday, 07/10/13 any time between 8 am-5 pm to have bloodwork completed before your appointment.  Your physician has requested that you go to the basement for the following lab work before leaving today: CBC, IBC  CC: Dr Selinda Flavin, Dr Charlyne Petrin

## 2013-05-08 NOTE — Progress Notes (Signed)
Chase Barrett [birth year] MRN [id number]   History of Present Illness:  This is a 67 year old white male with a new diagnosis of inflammatory bowel disease consistent with Crohn's ileitis demonstrated on a small bowel capsule endoscopy in May 2014. He has a long segment of ulcerated mucosa in the distal ileum. He presented initially with severe iron deficiency anemia. His colonoscopy in May 2014 was normal. He has been on iron supplements and Entocort 9 mg daily for the last 4 weeks. He remains asymptomatic as to abdominal pain, diarrhea, aphthous stomatitis or joint pains. He has had prior back surgeries but no history of sacroileitis. He has been on Plavix because of coronary artery disease. He will be going off Plavix in December 2014.  He is followed by Dr. Swaziland.   Past Medical History  Diagnosis Date  . BPH (benign prostatic hyperplasia)     s/p TURP  . Arthritis   . Diverticulosis   . Hepatic cyst   . Hepatic hemangioma   . Hemorrhoids   . DJD (degenerative joint disease)   . CAD (coronary artery disease)     a. s/p DES to RCA 08/23/12, residual moderate LAD dz for med rx;   b. ETT-MV 08/2012 to assess LAD:  EF 60%, no ischemia    . Hyperlipidemia   . PAT (paroxysmal atrial tachycardia)     Noted 08/2012 - BB started  . Anemia     Noted 08/2012  . Thrombocytopenia     Noted 08/2012  . Hematuria     Workup 2012 - no recent hematuria, reported cystoscopy last year unremarkable (short urethra, no major bladder lesions)  . DJD (degenerative joint disease)   . Crohn's disease    Past Surgical History  Procedure Laterality Date  . Back surgery      x5  . Cholecystectomy    . Shoulder arthroscopy    . Transurethral resection of prostate    . Cardiac catheterization      with stent placement    reports that he has never smoked. He has never used smokeless tobacco. He reports that  drinks alcohol. He reports that he does not use illicit drugs. family history includes  Lung cancer in his mother; Prostate cancer in his father; Stroke in his sister. There is no history of Colon cancer. No Known Allergies      Review of Systems: Weight has been stable. Negative for abdominal pain diarrhea  The remainder of the 10 point ROS is negative except as outlined in H&P   Physical Exam: General appearance  Well developed, in no distress. Eyes- non icteric. HEENT nontraumatic, normocephalic. Mouth no lesions, tongue papillated, no cheilosis. Neck supple without adenopathy, thyroid not enlarged, no carotid bruits, no JVD. Lungs Clear to auscultation bilaterally. Cor normal S1, normal S2, regular rhythm, no murmur,  quiet precordium. Abdomen: Soft relaxed abdomen with normal active bowel sounds. No distention. No palpable mass. Rectal: Soft and dark Hemoccult negative stool. Extremities no pedal edema. Skin no lesions. Neurological alert and oriented x 3. Psychological normal mood and affect.  Assessment and Plan:  Problem #82 67 year old white male with asymptomatic Crohn's disease of the terminal ileum. The diagnosis was made after he presented with iron deficiency anemia. He has never complained of diarrhea or abdominal pain. He will continue his Entocort taper which will be completed at the end of October 2014. We have discussed what to do next as to whether he ought to be put on a immunomodulator,  6 mercaptopurine,  or just keep him  off medications and follow his H&H. I suggest we repeat the small bowel capsule endoscopy in one year from the initial diagnosis, that is in May 2015.Marland Kitchen Since he is completely asymptomatic, he agrees that keeping him off medication may be more appropriate at this point, providing we followed him closely for GIB. He will have his hemoglobin checked every 2 months. He has no dietary restrictions at this point. We will be checking his CBC and iron studies today.   05/08/2013 Lina Sar

## 2013-05-09 ENCOUNTER — Other Ambulatory Visit: Payer: Self-pay | Admitting: Internal Medicine

## 2013-05-09 DIAGNOSIS — D649 Anemia, unspecified: Secondary | ICD-10-CM

## 2013-07-10 ENCOUNTER — Other Ambulatory Visit: Payer: Self-pay

## 2013-07-10 MED ORDER — CLOPIDOGREL BISULFATE 75 MG PO TABS: 75.0000 mg | ORAL_TABLET | Freq: Every day | ORAL | Status: DC

## 2013-07-11 ENCOUNTER — Ambulatory Visit (INDEPENDENT_AMBULATORY_CARE_PROVIDER_SITE_OTHER): Payer: Managed Care, Other (non HMO) | Admitting: Internal Medicine

## 2013-07-11 ENCOUNTER — Encounter: Payer: Self-pay | Admitting: Internal Medicine

## 2013-07-11 ENCOUNTER — Other Ambulatory Visit (INDEPENDENT_AMBULATORY_CARE_PROVIDER_SITE_OTHER): Payer: Managed Care, Other (non HMO)

## 2013-07-11 VITALS — BP 120/54 | HR 56 | Ht 70.0 in | Wt 169.8 lb

## 2013-07-11 DIAGNOSIS — D509 Iron deficiency anemia, unspecified: Secondary | ICD-10-CM

## 2013-07-11 DIAGNOSIS — K5000 Crohn's disease of small intestine without complications: Secondary | ICD-10-CM

## 2013-07-11 DIAGNOSIS — Z23 Encounter for immunization: Secondary | ICD-10-CM

## 2013-07-11 DIAGNOSIS — R933 Abnormal findings on diagnostic imaging of other parts of digestive tract: Secondary | ICD-10-CM

## 2013-07-11 LAB — IBC PANEL
Saturation Ratios: 34.1 % (ref 20.0–50.0)
Transferrin: 226.5 mg/dL (ref 212.0–360.0)

## 2013-07-11 LAB — CBC WITH DIFFERENTIAL/PLATELET
Basophils Relative: 0.4 % (ref 0.0–3.0)
Eosinophils Relative: 1.7 % (ref 0.0–5.0)
HCT: 43.4 % (ref 39.0–52.0)
Hemoglobin: 15 g/dL (ref 13.0–17.0)
Lymphocytes Relative: 11.3 % — ABNORMAL LOW (ref 12.0–46.0)
Lymphs Abs: 0.8 10*3/uL (ref 0.7–4.0)
Monocytes Relative: 5.1 % (ref 3.0–12.0)
Neutro Abs: 5.7 10*3/uL (ref 1.4–7.7)
RBC: 4.65 Mil/uL (ref 4.22–5.81)
RDW: 13.2 % (ref 11.5–14.6)

## 2013-07-11 NOTE — Patient Instructions (Addendum)
Your physician has requested that you go to the basement for the following lab work before leaving today: CBC, IBC, Hepatitis A and Hepatitis B serologies  You have been scheduled for a capsule endoscopy. Please follow instructions given to you at your visit today.  We have given you a flu shot today.  We will request record of your pneumovax from Dr Dimas Aguas. (4:02 pm....of note, patient states that he had pneumovax at hospital in 08/2012 rather than Dr Jeannette How office)  CC: Dr Selinda Flavin

## 2013-07-11 NOTE — Progress Notes (Signed)
Chase Barrett 1946/08/26 MRN [id number]   History of Present Illness:  This is a 67 year old white male with a new diagnosis of inflammatory bowel disease on a CT scan of the abdomen and pelvis in 2012 and again in 12/2012. and on a small bowel capsule endoscopy which showed a long segment of ulcerated mucosa in the distal ileum. He initially presented with severe iron deficiency anemia which corrected with iron. His colonoscopy in May 2014 was negative. He has negative IBD markers. He has just completed a 3 month course of Entocort 9 mg tapered down to 3 mg. He has a history of coronary artery disease and has been on Plavix which will be discontinued by Dr. Swaziland in December 2014. His last hemoglobin on 05/08/2013 was 15.4 and his iron saturation also normal. He denies diarrhea, abdominal pain or weight changes.   Past Medical History  Diagnosis Date  . BPH (benign prostatic hyperplasia)     s/p TURP  . Arthritis   . Diverticulosis   . Hepatic cyst   . Hepatic hemangioma   . Hemorrhoids   . DJD (degenerative joint disease)   . CAD (coronary artery disease)     a. s/p DES to RCA 08/23/12, residual moderate LAD dz for med rx;   b. ETT-MV 08/2012 to assess LAD:  EF 60%, no ischemia    . Hyperlipidemia   . PAT (paroxysmal atrial tachycardia)     Noted 08/2012 - BB started  . Anemia     Noted 08/2012  . Thrombocytopenia     Noted 08/2012  . Hematuria     Workup 2012 - no recent hematuria, reported cystoscopy last year unremarkable (short urethra, no major bladder lesions)  . DJD (degenerative joint disease)   . Crohn's disease    Past Surgical History  Procedure Laterality Date  . Back surgery      x5  . Cholecystectomy    . Shoulder arthroscopy    . Transurethral resection of prostate    . Cardiac catheterization      with stent placement    reports that he has never smoked. He has never used smokeless tobacco. He reports that he drinks alcohol. He reports that he does  not use illicit drugs. family history includes Lung cancer in his mother; Prostate cancer in his father; Stroke in his sister. There is no history of Colon cancer. No Known Allergies      Review of Systems:  The remainder of the 10 point ROS is negative except as outlined in H&P   Physical Exam: General appearance  Well developed, in no distress. Psychological normal mood and affect.  Assessment and Plan:  Problem #48 68 year old white male with a new diagnosis of inflammatory bowel disease; probably Crohn's ileitis. He is asymptomatic and we have been able to correct his iron deficiency anemia. It is unusual not to have any pain or diarrhea with Crohn's ileitis especially with negative IBD markers. and therefore I would like to repeat the small bowel capsule endoscopy before considering immunomodulators or biologicals. We will recheck his iron studies and CBC today and we will also give him a flu shot. He had a pneumococcal vaccine last year in December while hospitalized. We will check his hepatitis A and B serologies today.   07/11/2013 Lina Sar

## 2013-07-12 LAB — HEPATITIS B SURFACE ANTIGEN: Hepatitis B Surface Ag: NEGATIVE

## 2013-07-12 LAB — HEPATITIS A ANTIBODY, TOTAL: Hep A Total Ab: NONREACTIVE

## 2013-07-14 ENCOUNTER — Other Ambulatory Visit: Payer: Self-pay

## 2013-07-14 MED ORDER — CLOPIDOGREL BISULFATE 75 MG PO TABS: 75.0000 mg | ORAL_TABLET | Freq: Every day | ORAL | Status: DC

## 2013-07-21 ENCOUNTER — Ambulatory Visit (INDEPENDENT_AMBULATORY_CARE_PROVIDER_SITE_OTHER): Payer: Managed Care, Other (non HMO) | Admitting: Gastroenterology

## 2013-07-21 DIAGNOSIS — K5000 Crohn's disease of small intestine without complications: Secondary | ICD-10-CM

## 2013-07-21 DIAGNOSIS — D509 Iron deficiency anemia, unspecified: Secondary | ICD-10-CM

## 2013-07-21 DIAGNOSIS — R933 Abnormal findings on diagnostic imaging of other parts of digestive tract: Secondary | ICD-10-CM

## 2013-07-21 NOTE — Progress Notes (Signed)
Pt in for Capsule endoscopy. Pt tolerated procedure well without difficulty. Pt knows to look for capsule for the next several days. Will call pt next week to make sure he passed the capsule. Pt verbalized understanding.

## 2013-07-22 ENCOUNTER — Telehealth: Payer: Self-pay | Admitting: *Deleted

## 2013-07-22 NOTE — Telephone Encounter (Signed)
Message copied by Daphine Deutscher on Tue Jul 22, 2013  4:37 PM ------      Message from: [place], [place] S      Created: Tue Jul 22, 2013  4:23 PM       Rene Kocher- please call this pt-he had a capsule done on 11/3- incomplete - needs KUB on Friday or Monday if he is not sure he has passed the capsule...  ------

## 2013-07-25 NOTE — Telephone Encounter (Signed)
OK, GOOD

## 2013-07-25 NOTE — Telephone Encounter (Signed)
Called pt and he states he did pass his capsule.

## 2013-09-02 ENCOUNTER — Ambulatory Visit (INDEPENDENT_AMBULATORY_CARE_PROVIDER_SITE_OTHER): Payer: Managed Care, Other (non HMO) | Admitting: Physician Assistant

## 2013-09-02 ENCOUNTER — Encounter: Payer: Self-pay | Admitting: Physician Assistant

## 2013-09-02 VITALS — BP 128/66 | HR 46 | Ht 70.0 in | Wt 169.0 lb

## 2013-09-02 DIAGNOSIS — I10 Essential (primary) hypertension: Secondary | ICD-10-CM

## 2013-09-02 DIAGNOSIS — R001 Bradycardia, unspecified: Secondary | ICD-10-CM

## 2013-09-02 DIAGNOSIS — E785 Hyperlipidemia, unspecified: Secondary | ICD-10-CM

## 2013-09-02 DIAGNOSIS — I2510 Atherosclerotic heart disease of native coronary artery without angina pectoris: Secondary | ICD-10-CM

## 2013-09-02 DIAGNOSIS — I498 Other specified cardiac arrhythmias: Secondary | ICD-10-CM

## 2013-09-02 NOTE — Patient Instructions (Signed)
Your physician wants you to follow-up in:   6 MONTHS WITH DR JORDAN  You will receive a reminder letter in the mail two months in advance. If you don't receive a letter, please call our office to schedule the follow-up appointment. Your physician recommends that you continue on your current medications as directed. Please refer to the Current Medication list given to you today.  

## 2013-09-02 NOTE — Progress Notes (Signed)
[address] [place], [place]  [address] Phone: [phone] Fax:  [phone]  Date:  09/02/2013   ID:  Angie Hogg, DOB 1945-12-03, MRN [id number]  PCP:  Selinda Flavin, MD  Cardiologist:  Dr. Peter Swaziland     History of Present Illness: Chase Barrett is a 67 y.o. male with a hx of CAD, HL, BPH, s/p TURP and hematuria.  Echocardiogram 11/09: EF 55%, mild MR, mild LAE.  He was admitted 08/2012 with [place]. LHC 08/22/12:  dLAD 80%, mOM 30-40%, mRCA 90%, EF 55-65%. PCI 08/23/12: Promus Premier DES to the RCA. Medical therapy was recommended for his residual CAD.  ETT-Myoview (08/2012):  No ischemia, EF 60%, normal study.  He has been taken off of beta blocker in the past due to bradycardia.  Last seen by Dr. Peter Swaziland in 04/2013.    He has been doing well since last seen.  The patient denies exertional chest pain, shortness of breath, syncope, orthopnea, PND or significant pedal edema. He has occasional L sided CP that occurs at rest that has been noted for years without change.  He exercises quite a bit.  He typically rides a bike several times a week without difficulty.    Recent Labs: 04/30/2013: ALT 23; Creatinine 0.9; HDL 38.70*; LDL (calc) 38; Potassium 3.6  07/11/2013: Hemoglobin 15.0   Wt Readings from Last 3 Encounters:  09/02/13 169 lb (76.658 kg)  07/11/13 169 lb 12.8 oz (77.021 kg)  05/08/13 164 lb 8 oz (74.617 kg)     Past Medical History  Diagnosis Date  . BPH (benign prostatic hyperplasia)     s/p TURP  . Arthritis   . Diverticulosis   . Hepatic cyst   . Hepatic hemangioma   . Hemorrhoids   . DJD (degenerative joint disease)   . CAD (coronary artery disease)     a. s/p DES to RCA 08/23/12, residual moderate LAD dz for med rx;   b. ETT-MV 08/2012 to assess LAD:  EF 60%, no ischemia    . Hyperlipidemia   . PAT (paroxysmal atrial tachycardia)     Noted 08/2012 - BB started  . Anemia     Noted 08/2012  . Thrombocytopenia     Noted 08/2012   . Hematuria     Workup 2012 - no recent hematuria, reported cystoscopy last year unremarkable (short urethra, no major bladder lesions)  . DJD (degenerative joint disease)   . Crohn's disease     Current Outpatient Prescriptions  Medication Sig Dispense Refill  . alfuzosin (UROXATRAL) 10 MG 24 hr tablet Take 10 mg by mouth daily.        Marland Kitchen aspirin EC 81 MG EC tablet Take 1 tablet (81 mg total) by mouth daily.      Marland Kitchen atorvastatin (LIPITOR) 40 MG tablet Take 1 tablet (40 mg total) by mouth daily.  90 tablet  3  . clopidogrel (PLAVIX) 75 MG tablet Take 1 tablet (75 mg total) by mouth daily.  90 tablet  3  . Cyanocobalamin (VITAMIN B 12 PO) Take 1 tablet by mouth 2 (two) times daily.      . IRON PO Take 1 tablet by mouth daily.      Marland Kitchen losartan (COZAAR) 25 MG tablet Take 1 tablet (25 mg total) by mouth daily.  90 tablet  3  . nitroGLYCERIN (NITROSTAT) 0.4 MG SL tablet Place 1 tablet (0.4 mg total) under the tongue every 5 (five) minutes as  needed for chest pain (up to 3 doses). WARNING: Do not take this medicine if you have taken Cialis (Tadalafil) within the last 24 hours.  25 tablet  4  . tadalafil (CIALIS) 20 MG tablet Take 1 tablet (20 mg total) by mouth daily as needed. WARNING: Do not take this medicine if you have taken Nitroglycerin within the last 24 hours.      . traMADol (ULTRAM) 50 MG tablet Take 50 mg by mouth every 6 (six) hours as needed for pain.       . vitamin C (ASCORBIC ACID) 500 MG tablet Take 500 mg by mouth daily.       No current facility-administered medications for this visit.    Allergies:   Review of patient's allergies indicates no known allergies.   Social History:  The patient  reports that he has never smoked. He has never used smokeless tobacco. He reports that he drinks alcohol. He reports that he does not use illicit drugs.   Family History:  The patient's family history includes Lung cancer in his mother; Prostate cancer in his father; Stroke in his sister.  There is no history of Colon cancer.   ROS:  Please see the history of present illness.  He has a lot of nasal congestion and occasional cough for which he takes Mucinex and uses saline nasal spray.  All other systems reviewed and negative.   PHYSICAL EXAM: VS:  BP 128/66  Pulse 46  Ht 5\' 10"  (1.778 m)  Wt 169 lb (76.658 kg)  BMI 24.25 kg/m2 Well nourished, well developed, in no acute distress HEENT: normal Neck: no JVD Vascular:  No carotid bruits Cardiac:  normal S1, S2; RRR; no murmur Lungs:  clear to auscultation bilaterally, no wheezing, rhonchi or rales Abd: soft, nontender, no hepatomegaly Ext: no edema Skin: warm and dry Neuro:  CNs 2-12 intact, no focal abnormalities noted  EKG:  Sinus bradycardia, HR 46, nonspecific ST-T wave changes, no change from prior tracing     ASSESSMENT AND PLAN:  1. CAD:  No angina.  Continue ASA, Plavix and statin.  He should be able to come off of Plavix at the end of this month.  I will check with Dr. Peter [place] before we discontinue this.   2. Hypertension:  Controlled. 3. Bradycardia:  Asymptomatic.  4. Hyperlipidemia:  Lipids good in 04/2013.  I completed a health questionnaire for him today for his insurance.  Continue statin.  5. Disposition:  F/u with Dr. Peter [place] in 6 mos.   Signed, Tereso Newcomer, PA-C  09/02/2013 8:48 AM

## 2013-09-03 ENCOUNTER — Telehealth: Payer: Self-pay | Admitting: Physician Assistant

## 2013-09-03 NOTE — Telephone Encounter (Signed)
Please let Chase Barrett know that I reviewed his case with Dr. Peter [place]. He may stop Plavix now. He should remain on ASA indefinitely. Tereso Newcomer, PA-C   09/03/2013 5:08 PM

## 2013-09-04 ENCOUNTER — Telehealth: Payer: Self-pay | Admitting: Cardiology

## 2013-09-04 ENCOUNTER — Telehealth: Payer: Self-pay | Admitting: Internal Medicine

## 2013-09-04 ENCOUNTER — Ambulatory Visit: Payer: Managed Care, Other (non HMO) | Admitting: Physician Assistant

## 2013-09-04 NOTE — Telephone Encounter (Signed)
Patient returned call. Informed him of message from [place], [place], below regarding to "stop Plavix now and remain on his ASA dose indefinitely". Patient verbalized appreciation and understanding of treatment plan/instructions.

## 2013-09-04 NOTE — Telephone Encounter (Signed)
Patient is asking for capsule report.( copy on MD desk) Please, advise.

## 2013-09-04 NOTE — Telephone Encounter (Signed)
LMTCB (home#)/work# states he is available at home# today. 12/18@8 :39 am/pe

## 2013-09-04 NOTE — Telephone Encounter (Signed)
Follow UP  Pt returned call//SR

## 2013-09-04 NOTE — Telephone Encounter (Signed)
Returned call to patient no answer.LMTC. 

## 2013-09-04 NOTE — Telephone Encounter (Signed)
LMTCB (home#) 12/18 @2 :06pm/pe

## 2013-09-05 MED ORDER — AZATHIOPRINE 100 MG PO TABS: 100.0000 mg | ORAL_TABLET | Freq: Every day | ORAL | Status: DC

## 2013-09-05 MED ORDER — BUDESONIDE 3 MG PO CP24: ORAL_CAPSULE | ORAL | Status: DC

## 2013-09-05 NOTE — Telephone Encounter (Signed)
Returned call to patient he stated he already received a call back and everything is taken care of.

## 2013-09-05 NOTE — Telephone Encounter (Signed)
I have spoken to the pt , he is having abd. Pain and frequent stools. He has been off Entecort x 2 months. His SBCE shows active Crohn's disease. We have discussed therapy. I suggest immunomodulator and brief course of Entecort. Please send Imuran 100mg , #30 1 po qd, 3 refills. And Entecort 3mg , ##100, 1 refill. Take 3 po qd x 2 weeks, 2 po qd x 2 weeks then 1 po qd x 2 weeks.

## 2013-09-05 NOTE — Telephone Encounter (Signed)
Rx's sent to pharmacy. Patient aware. 

## 2013-10-07 ENCOUNTER — Encounter: Payer: Self-pay | Admitting: Internal Medicine

## 2013-11-03 ENCOUNTER — Telehealth: Payer: Self-pay | Admitting: Internal Medicine

## 2013-11-03 MED ORDER — AZATHIOPRINE 100 MG PO TABS: 100.0000 mg | ORAL_TABLET | Freq: Every day | ORAL | Status: DC

## 2013-11-03 NOTE — Telephone Encounter (Signed)
Rx sent 

## 2013-12-02 ENCOUNTER — Ambulatory Visit (INDEPENDENT_AMBULATORY_CARE_PROVIDER_SITE_OTHER): Payer: Managed Care, Other (non HMO) | Admitting: Internal Medicine

## 2013-12-02 ENCOUNTER — Other Ambulatory Visit: Payer: Managed Care, Other (non HMO)

## 2013-12-02 ENCOUNTER — Encounter: Payer: Self-pay | Admitting: Internal Medicine

## 2013-12-02 VITALS — BP 138/60 | HR 56 | Ht 69.75 in | Wt 174.2 lb

## 2013-12-02 DIAGNOSIS — K5000 Crohn's disease of small intestine without complications: Secondary | ICD-10-CM

## 2013-12-02 DIAGNOSIS — A0472 Enterocolitis due to Clostridium difficile, not specified as recurrent: Secondary | ICD-10-CM

## 2013-12-02 NOTE — Patient Instructions (Signed)
Your physician has requested that you go to the basement for the following lab work before leaving today: C Diff  Please discontinue Imuran for now.  We have given you samples of a probiotic. This puts good bacteria back into your colon. You should take 1 capsule by mouth once daily. If this works well for you, it can be purchased over the counter.  Please follow up with Dr Olevia Perches in 4 weeks.  CC: Dr Rory Percy

## 2013-12-02 NOTE — Progress Notes (Signed)
Chase Barrett [birth year] [id number]  Note: This dictation was prepared with Dragon digital system. Any transcriptional errors that result from this procedure are unintentional.   History of Present Illness:  This is a 68 year old white male with a history of Crohn's disease diagnosed on a CT scan of the abdomen in 2012 and in April 2014 on a small bowel capsule endoscopy which showed a large segment of the ulcerated mucosa in the distal ileum. A repeat small bowel capsule endoscopy in November 2014 showed active Crohn's disease in the ileum. The capsule never reached the cecum. The colonoscopy in May 2014 was negative. His IBD markers are negative. He was treated with Entocort for 3 months and now he remains asymptomatic. He comes today and reports having a skin infection on his leg in February 2015 while visiting [place]. He was treated for a staph infection in [facility] with IV  And oralcephalexin for 11 days and subsequently developed diarrhea. He tested C. difficile positive and was treated with vancomycin 125 mg 4 times a day for 2 weeks. His diarrhea subsided . He finished vancomycin one week ago. He is starting to have more diarrhea again. He has been on Imuran 100 mg daily.since December 2014    Past Medical History  Diagnosis Date  . BPH (benign prostatic hyperplasia)     s/p TURP  . Arthritis   . Diverticulosis   . Hepatic cyst   . Hepatic hemangioma   . Hemorrhoids   . DJD (degenerative joint disease)   . CAD (coronary artery disease)     a. s/p DES to RCA 08/23/12, residual moderate LAD dz for med rx;   b. ETT-MV 08/2012 to assess LAD:  EF 60%, no ischemia    . Hyperlipidemia   . PAT (paroxysmal atrial tachycardia)     Noted 08/2012 - BB started  . Anemia     Noted 08/2012  . Thrombocytopenia     Noted 08/2012  . Hematuria     Workup 2012 - no recent hematuria, reported cystoscopy last year unremarkable (short urethra, no major bladder lesions)  . DJD  (degenerative joint disease)   . Crohn's disease     Past Surgical History  Procedure Laterality Date  . Back surgery      x5  . Cholecystectomy    . Shoulder arthroscopy    . Transurethral resection of prostate    . Cardiac catheterization      with stent placement    No Known Allergies  Family history and social history have been reviewed.  Review of Systems:   The remainder of the 10 point ROS is negative except as outlined in the H&P  Physical Exam: General Appearance Well developed, in no distress Eyes  Non icteric  HEENT  Non traumatic, normocephalic  Mouth No lesion, tongue papillated, no cheilosis Neck Supple without adenopathy, thyroid not enlarged, no carotid bruits, no JVD Lungs Clear to auscultation bilaterally COR Normal S1, normal S2, regular rhythm, no murmur, quiet precordium Abdomen soft nontender with normoactive bowel sounds. No distention. No tympany Rectal not done Extremities  No pedal edema Skin No lesions Neurological Alert and oriented x 3 Psychological Normal mood and affect  Assessment and Plan:   Problem #1 Crohn's disease of the distal ileum. Pt is currently asymptomatic.Positive repeat SBCE 07/2013  Problem #2 C. difficile diarrhea related to use of cephalexin for a skin infection. He was treated appropriately with vancomycin for 2 weeks. The diarrhea seems to be  coming back. We will obtain a stool specimen for C. difficile. I have asked patient to discontinue Imuran because of immunosuppressive effect and I have given her samples of a probiotic to take every day. I will see him in 4 weeks.    [name] 12/02/2013

## 2013-12-03 LAB — CLOSTRIDIUM DIFFICILE BY PCR: CDIFFPCR: NOT DETECTED

## 2014-01-06 ENCOUNTER — Ambulatory Visit (INDEPENDENT_AMBULATORY_CARE_PROVIDER_SITE_OTHER): Payer: Managed Care, Other (non HMO) | Admitting: Internal Medicine

## 2014-01-06 ENCOUNTER — Encounter: Payer: Self-pay | Admitting: Internal Medicine

## 2014-01-06 VITALS — BP 120/60 | HR 60 | Ht 69.75 in | Wt 172.2 lb

## 2014-01-06 DIAGNOSIS — D509 Iron deficiency anemia, unspecified: Secondary | ICD-10-CM

## 2014-01-06 DIAGNOSIS — K5090 Crohn's disease, unspecified, without complications: Secondary | ICD-10-CM

## 2014-01-06 NOTE — Patient Instructions (Signed)
CC:Dr Rory Percy

## 2014-01-06 NOTE — Progress Notes (Signed)
Chase Barrett Chase Barrett [birth year] [id number]  Note: This dictation was prepared with Dragon digital system. Any transcriptional errors that result from this procedure are unintentional.   History of Present Illness: This is a 68 year old white male with Crohn's ileitis diagnosed in 2012 on CT scan of the abdomen and confirmed on small bowel capsule endoscopy in April 2014 and again in November 2014. He has negative  IBD markers. Patient had an episode of C. difficile colitis the while visiting [place] in February 2015 after taking a course of antibiotics.for skin infection. He was treated with vancomycin 125 mg 4 times a day for 2 weeks. When I saw him on 12/02/2013 we repeated the C. difficile toxin which was negative. He has done very well since then. We discontinued the Imuran because of the risk of immunosuppression in the setting of C. difficile colitis. He is still off Imuran at this point. 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 BPH (benign prostatic hyperplasia)     s/p TURP  . Arthritis   . Diverticulosis   . Hepatic cyst   . Hepatic hemangioma   . Hemorrhoids   . DJD (degenerative joint disease)   . CAD (coronary artery disease)     a. s/p DES to RCA 08/23/12, residual moderate LAD dz for med rx;   b. ETT-MV 08/2012 to assess LAD:  EF 60%, no ischemia    . Hyperlipidemia   . PAT (paroxysmal atrial tachycardia)     Noted 08/2012 - BB started  . Anemia     Noted 08/2012  . Thrombocytopenia     Noted 08/2012  . Hematuria     Workup 2012 - no recent hematuria, reported cystoscopy last year unremarkable (short urethra, no major bladder lesions)  . DJD (degenerative joint disease)   . Crohn's  disease   . C. difficile diarrhea     Past Surgical History  Procedure Laterality Date  . Back surgery      x5  . Cholecystectomy    . Shoulder arthroscopy    . Transurethral resection of prostate    . Cardiac catheterization      with stent placement    No Known Allergies  Family history and social history have been reviewed.  Review of Systems:   The remainder of the 10 point ROS is negative except as outlined in the H&P  Physical Exam: General Appearance Well developed, in no distress Eyes  Non icteric  HEENT  Non traumatic, normocephalic  Mouth No lesion, tongue papillated, no cheilosis Neck Supple without adenopathy, thyroid not enlarged, no carotid bruits, no JVD Lungs Clear to auscultation bilaterally COR Normal S1, normal S2, regular rhythm, no murmur, quiet precordium Abdomen soft nontender Rectal not done Extremities 1+ pitting edema of the right leg. Clean appearing of 2 cm healing skin lesion on the anterior shin of the right leg no active drainage of purulent material Skin No lesions Neurological Alert and oriented x 3 Psychological Normal mood and affect  Assessment and Plan:   #1Healing skin lesion on the right shin with surrounding edema status post treatment of staph infection. This lesion needs to be healed before we resume the  Imuran #2 Crohn's disease of the terminal ileum documented on two capsule endoscopies. Patient remains asymptomatic as far as diarrhea or abdominal pain. He will return back on Imuran and a skin infection heals.     [place] Dragon 01/06/2014

## 2014-03-10 ENCOUNTER — Other Ambulatory Visit: Payer: Self-pay

## 2014-03-10 MED ORDER — ATORVASTATIN CALCIUM 40 MG PO TABS: 40.0000 mg | ORAL_TABLET | Freq: Every day | ORAL | Status: DC

## 2014-03-12 ENCOUNTER — Telehealth: Payer: Self-pay | Admitting: Cardiology

## 2014-03-12 NOTE — Telephone Encounter (Signed)
New message  ° ° °Returning call back to nurse.  °

## 2014-03-12 NOTE — Telephone Encounter (Signed)
Returned call to patient he stated he will keep his appointment tomorrow 03/13/14 with Dr.Jordan.

## 2014-03-13 ENCOUNTER — Encounter: Payer: Self-pay | Admitting: Cardiology

## 2014-03-13 ENCOUNTER — Ambulatory Visit (INDEPENDENT_AMBULATORY_CARE_PROVIDER_SITE_OTHER): Payer: Managed Care, Other (non HMO) | Admitting: Cardiology

## 2014-03-13 VITALS — BP 132/68 | HR 51 | Ht 69.5 in | Wt 174.4 lb

## 2014-03-13 DIAGNOSIS — I498 Other specified cardiac arrhythmias: Secondary | ICD-10-CM

## 2014-03-13 DIAGNOSIS — R001 Bradycardia, unspecified: Secondary | ICD-10-CM

## 2014-03-13 DIAGNOSIS — E785 Hyperlipidemia, unspecified: Secondary | ICD-10-CM

## 2014-03-13 DIAGNOSIS — I2510 Atherosclerotic heart disease of native coronary artery without angina pectoris: Secondary | ICD-10-CM

## 2014-03-13 NOTE — Patient Instructions (Signed)
Continue your current therapy  I will see you in one year   

## 2014-03-13 NOTE — Progress Notes (Signed)
Chase Barrett Date of Birth: [birth year] Medical Record #[id number]  History of Present Illness: Chase Barrett is seen for followup. He presented in December with unstable angina. He was found to have by 90% stenosis in the mid RCA that was treated with a drug-eluting stent. He had an 80% stenosis in the distal LAD. On followup he was noted to have atypical chest pain. He had a stress Myoview study in December 2013 which was normal. Ejection fraction was 60%. Previously he developed significant bradycardia on metoprolol. This was discontinued. His blood pressure elevated so we started him on ACEi. He developed a cough so was switched to losartan. He reports he is doing very well. Very active cycling at [facility] Has recent achilles tendonitis and is wearing a boot on the left. Has occasional chest discomfort at random times. This is mild and not exertional.  Current Outpatient Prescriptions on File Prior to Visit  Medication Sig Dispense Refill  . alfuzosin (UROXATRAL) 10 MG 24 hr tablet Take 10 mg by mouth daily.        Marland Kitchen aspirin EC 81 MG EC tablet Take 1 tablet (81 mg total) by mouth daily.      Marland Kitchen atorvastatin (LIPITOR) 40 MG tablet Take 1 tablet (40 mg total) by mouth daily.  90 tablet  0  . Cyanocobalamin (VITAMIN B 12 PO) Take 1 tablet by mouth 2 (two) times daily.      . IRON PO Take 1 tablet by mouth daily.      Marland Kitchen losartan (COZAAR) 25 MG tablet Take 1 tablet (25 mg total) by mouth daily.  90 tablet  3  . nitroGLYCERIN (NITROSTAT) 0.4 MG SL tablet Place 1 tablet (0.4 mg total) under the tongue every 5 (five) minutes as needed for chest pain (up to 3 doses). WARNING: Do not take this medicine if you have taken Cialis (Tadalafil) within the last 24 hours.  25 tablet  4  . tadalafil (CIALIS) 20 MG tablet Take 1 tablet (20 mg total) by mouth daily as needed. WARNING: Do not take this medicine if you have taken Nitroglycerin within the last 24 hours.      . traMADol (ULTRAM) 50 MG tablet Take  50 mg by mouth every 6 (six) hours as needed for pain.       . vitamin C (ASCORBIC ACID) 500 MG tablet Take 500 mg by mouth daily.       No current facility-administered medications on file prior to visit.    No Known Allergies  Past Medical History  Diagnosis Date  . BPH (benign prostatic hyperplasia)     s/p TURP  . Arthritis   . Diverticulosis   . Hepatic cyst   . Hepatic hemangioma   . Hemorrhoids   . DJD (degenerative joint disease)   . CAD (coronary artery disease)     a. s/p DES to RCA 08/23/12, residual moderate LAD dz for med rx;   b. ETT-MV 08/2012 to assess LAD:  EF 60%, no ischemia    . Hyperlipidemia   . PAT (paroxysmal atrial tachycardia)     Noted 08/2012 - BB started  . Anemia     Noted 08/2012  . Thrombocytopenia     Noted 08/2012  . Hematuria     Workup 2012 - no recent hematuria, reported cystoscopy last year unremarkable (short urethra, no major bladder lesions)  . DJD (degenerative joint disease)   . Crohn's disease   . C. difficile diarrhea  Past Surgical History  Procedure Laterality Date  . Back surgery      x5  . Cholecystectomy    . Shoulder arthroscopy    . Transurethral resection of prostate    . Cardiac catheterization      with stent placement    History  Smoking status  . Never Smoker   Smokeless tobacco  . Never Used    History  Alcohol Use  . Yes    Comment: 2 cocktails 2-3 times per week    Family History  Problem Relation Age of Onset  . Lung cancer Mother   . Prostate cancer Father   . Stroke Sister   . Colon cancer Neg Hx     Review of Systems: As noted in history of present illness. All other systems were reviewed and are negative.  Physical Exam: BP 132/68  Pulse 51  Ht 5' 9.5" (1.765 m)  Wt 174 lb 6.4 oz (79.107 kg)  BMI 25.39 kg/m2 He is a pleasant white male in no acute distress. HEENT: Normal.  Oropharynx is clear. Neck is without JVD, adenopathy, thyromegaly, or bruits. Lungs:  Clear Cardiovascular: Regular rate and rhythm, normal S1 and S2, no gallop or murmur. Abdomen: Soft and nontender. No masses or bruits. Extremities: Good pedal pulses. No edema.  Neuro: Alert and oriented x3. Cranial nerves II through XII are intact. LABORATORY DATA: Lab Results  Component Value Date   WBC 7.0 07/11/2013   HGB 15.0 07/11/2013   HCT 43.4 07/11/2013   PLT 162.0 07/11/2013   GLUCOSE 93 04/30/2013   CHOL 107 04/30/2013   TRIG 150.0* 04/30/2013   HDL 38.70* 04/30/2013   LDLCALC 38 04/30/2013   ALT 23 04/30/2013   AST 21 04/30/2013   NA 142 04/30/2013   K 3.6 04/30/2013   CL 109 04/30/2013   CREATININE 0.9 04/30/2013   BUN 14 04/30/2013   CO2 29 04/30/2013   TSH 0.791 08/21/2012   INR 1.05 03/17/2013   HGBA1C 5.3 08/21/2012     Assessment / Plan: 1. Coronary disease status post stenting of the mid RCA with a drug-eluting stent on 08/23/2012. Continue ASA and statin therapy. Followup stress Myoview study Dec 2013 was normal showing no evidence of ischemia in the distal LAD territory. Metoprolol discontinued due to to marked bradycardia. I will followup again in 1 year.   2. Hypertension-now well controlled.  3. Hyperlipidemia. Well controlled on Lipitor. We will check fasting lab work.

## 2014-03-16 ENCOUNTER — Other Ambulatory Visit (INDEPENDENT_AMBULATORY_CARE_PROVIDER_SITE_OTHER): Payer: Managed Care, Other (non HMO)

## 2014-03-16 DIAGNOSIS — E785 Hyperlipidemia, unspecified: Secondary | ICD-10-CM

## 2014-03-16 DIAGNOSIS — I2510 Atherosclerotic heart disease of native coronary artery without angina pectoris: Secondary | ICD-10-CM

## 2014-03-16 DIAGNOSIS — R001 Bradycardia, unspecified: Secondary | ICD-10-CM

## 2014-03-16 DIAGNOSIS — I498 Other specified cardiac arrhythmias: Secondary | ICD-10-CM

## 2014-03-16 LAB — BASIC METABOLIC PANEL
BUN: 20 mg/dL (ref 6–23)
CHLORIDE: 106 meq/L (ref 96–112)
CO2: 30 meq/L (ref 19–32)
Calcium: 8.8 mg/dL (ref 8.4–10.5)
Creatinine, Ser: 0.9 mg/dL (ref 0.4–1.5)
GFR: 89.19 mL/min (ref >60.00)
Glucose, Bld: 103 mg/dL — ABNORMAL HIGH (ref 70–99)
Potassium: 4 mEq/L (ref 3.5–5.1)
Sodium: 141 mEq/L (ref 135–145)

## 2014-03-16 LAB — LIPID PANEL
CHOL/HDL RATIO: 2
Cholesterol: 100 mg/dL (ref 0–200)
HDL: 42.8 mg/dL (ref >39.00)
LDL Cholesterol: 47 mg/dL (ref 0–99)
NONHDL: 57.2
Triglycerides: 53 mg/dL (ref 0.0–149.0)
VLDL: 10.6 mg/dL (ref 0.0–40.0)

## 2014-03-16 LAB — HEPATIC FUNCTION PANEL
ALT: 20 U/L (ref 0–53)
AST: 21 U/L (ref 0–37)
Albumin: 4.1 g/dL (ref 3.5–5.2)
Alkaline Phosphatase: 70 U/L (ref 39–117)
BILIRUBIN TOTAL: 1.2 mg/dL (ref 0.2–1.2)
Bilirubin, Direct: 0.2 mg/dL (ref 0.0–0.3)
Total Protein: 6.1 g/dL (ref 6.0–8.3)

## 2014-03-28 ENCOUNTER — Other Ambulatory Visit: Payer: Self-pay | Admitting: Cardiology

## 2014-04-27 ENCOUNTER — Other Ambulatory Visit: Payer: Self-pay | Admitting: Dermatology

## 2014-04-27 DIAGNOSIS — C4491 Basal cell carcinoma of skin, unspecified: Secondary | ICD-10-CM

## 2014-04-27 HISTORY — DX: Basal cell carcinoma of skin, unspecified: C44.91

## 2014-05-26 ENCOUNTER — Other Ambulatory Visit: Payer: Self-pay | Admitting: Orthopaedic Surgery

## 2014-05-26 DIAGNOSIS — M25572 Pain in left ankle and joints of left foot: Secondary | ICD-10-CM

## 2014-06-08 ENCOUNTER — Ambulatory Visit
Admission: RE | Admit: 2014-06-08 | Discharge: 2014-06-08 | Disposition: A | Discharge status: Home / Self Care | Payer: Managed Care, Other (non HMO) | Source: Ambulatory Visit | Attending: Orthopaedic Surgery | Admitting: Orthopaedic Surgery

## 2014-06-08 DIAGNOSIS — M25572 Pain in left ankle and joints of left foot: Secondary | ICD-10-CM

## 2014-06-17 ENCOUNTER — Other Ambulatory Visit: Payer: Self-pay | Admitting: *Deleted

## 2014-06-17 MED ORDER — ATORVASTATIN CALCIUM 40 MG PO TABS
40.0000 mg | ORAL_TABLET | Freq: Every day | ORAL | Status: DC
Start: 2014-06-17 — End: 2014-12-14

## 2014-07-10 ENCOUNTER — Telehealth: Payer: Self-pay | Admitting: Cardiology

## 2014-07-10 MED ORDER — LOSARTAN POTASSIUM 25 MG PO TABS: 25.0000 mg | ORAL_TABLET | Freq: Every day | ORAL | Status: DC

## 2014-07-10 NOTE — Telephone Encounter (Signed)
Mr. Chase Barrett is calling to have a new prescription for Losartan [facility] to [facility] [place] . Please call if you have any questions . [facility] -[phone] Telephone- [phone]   Thanks

## 2014-07-10 NOTE — Telephone Encounter (Signed)
Rx was sent to pharmacy electronically. 

## 2014-07-23 ENCOUNTER — Other Ambulatory Visit: Payer: Self-pay | Admitting: Dermatology

## 2014-08-27 ENCOUNTER — Encounter (HOSPITAL_COMMUNITY): Payer: Self-pay | Admitting: Cardiology

## 2014-12-14 ENCOUNTER — Other Ambulatory Visit: Payer: Self-pay | Admitting: Cardiology

## 2014-12-14 MED ORDER — ATORVASTATIN CALCIUM 40 MG PO TABS: 40.0000 mg | ORAL_TABLET | Freq: Every day | ORAL | Status: DC

## 2015-01-11 ENCOUNTER — Telehealth: Payer: Self-pay | Admitting: Cardiology

## 2015-01-12 NOTE — Telephone Encounter (Signed)
Closed encounter °

## 2015-03-30 ENCOUNTER — Ambulatory Visit (INDEPENDENT_AMBULATORY_CARE_PROVIDER_SITE_OTHER): Payer: BLUE CROSS/BLUE SHIELD | Admitting: Cardiology

## 2015-03-30 ENCOUNTER — Encounter: Payer: Self-pay | Admitting: Cardiology

## 2015-03-30 VITALS — BP 122/62 | HR 48 | Ht 70.0 in | Wt 170.5 lb

## 2015-03-30 DIAGNOSIS — R001 Bradycardia, unspecified: Secondary | ICD-10-CM

## 2015-03-30 DIAGNOSIS — I2510 Atherosclerotic heart disease of native coronary artery without angina pectoris: Secondary | ICD-10-CM | POA: Diagnosis not present

## 2015-03-30 DIAGNOSIS — E785 Hyperlipidemia, unspecified: Secondary | ICD-10-CM

## 2015-03-30 LAB — CBC WITH DIFFERENTIAL/PLATELET
Basophils Absolute: 0 10*3/uL (ref 0.0–0.1)
Basophils Relative: 0 % (ref 0–1)
Eosinophils Absolute: 0.3 10*3/uL (ref 0.0–0.7)
Eosinophils Relative: 5 % (ref 0–5)
HEMATOCRIT: 45.6 % (ref 39.0–52.0)
Hemoglobin: 15.9 g/dL (ref 13.0–17.0)
LYMPHS PCT: 14 % (ref 12–46)
Lymphs Abs: 0.8 10*3/uL (ref 0.7–4.0)
MCH: 32 pg (ref 26.0–34.0)
MCHC: 34.9 g/dL (ref 30.0–36.0)
MCV: 91.8 fL (ref 78.0–100.0)
MONO ABS: 0.6 10*3/uL (ref 0.1–1.0)
MONOS PCT: 10 % (ref 3–12)
MPV: 10.2 fL (ref 8.6–12.4)
NEUTROS PCT: 71 % (ref 43–77)
Neutro Abs: 4 10*3/uL (ref 1.7–7.7)
Platelets: 144 10*3/uL — ABNORMAL LOW (ref 150–400)
RBC: 4.97 MIL/uL (ref 4.22–5.81)
RDW: 13.9 % (ref 11.5–15.5)
WBC: 5.6 10*3/uL (ref 4.0–10.5)

## 2015-03-30 LAB — HEPATIC FUNCTION PANEL
ALT: 22 U/L (ref 0–53)
AST: 26 U/L (ref 0–37)
Albumin: 4.3 g/dL (ref 3.5–5.2)
Alkaline Phosphatase: 82 U/L (ref 39–117)
BILIRUBIN INDIRECT: 1.2 mg/dL (ref 0.2–1.2)
Bilirubin, Direct: 0.3 mg/dL (ref 0.0–0.3)
TOTAL PROTEIN: 5.9 g/dL — AB (ref 6.0–8.3)
Total Bilirubin: 1.5 mg/dL — ABNORMAL HIGH (ref 0.2–1.2)

## 2015-03-30 LAB — BASIC METABOLIC PANEL
BUN: 16 mg/dL (ref 6–23)
CHLORIDE: 106 meq/L (ref 96–112)
CO2: 28 meq/L (ref 19–32)
CREATININE: 0.86 mg/dL (ref 0.50–1.35)
Calcium: 9 mg/dL (ref 8.4–10.5)
Glucose, Bld: 98 mg/dL (ref 70–99)
Potassium: 4.6 mEq/L (ref 3.5–5.3)
Sodium: 143 mEq/L (ref 135–145)

## 2015-03-30 LAB — LIPID PANEL
Cholesterol: 102 mg/dL (ref 0–200)
HDL: 39 mg/dL — ABNORMAL LOW (ref >=40)
LDL CALC: 44 mg/dL (ref 0–99)
TRIGLYCERIDES: 96 mg/dL (ref <150)
Total CHOL/HDL Ratio: 2.6 Ratio
VLDL: 19 mg/dL (ref 0–40)

## 2015-03-30 MED ORDER — LOSARTAN POTASSIUM 25 MG PO TABS: 25.0000 mg | ORAL_TABLET | Freq: Every day | ORAL | Status: AC

## 2015-03-30 NOTE — Patient Instructions (Signed)
We will check lab work today  We will schedule you for a stress test in August  Continue your current therapy  I will follow up in one year

## 2015-03-30 NOTE — Progress Notes (Signed)
Chase Barrett Date of Birth: 05-Jan-1946 Medical Record #[id number]  History of Present Illness: Mr. Chase Barrett is seen for followup of CAD. He presented in December with unstable angina. He was found to have by 90% stenosis in the mid RCA that was treated with a drug-eluting stent. He had an 80% stenosis in the distal LAD. On followup he was noted to have atypical chest pain. He had a stress Myoview study in December 2013 which was normal. Ejection fraction was 60%. He developed significant bradycardia on metoprolol. This was discontinued. His blood pressure elevated so we started him on ACEi. He developed a cough so was switched to losartan.  On follow up today he reports he is doing very well. Very active cycling and swimming at [facility]. He has recovered from Achilles tendon surgery in October. Has occasional chest discomfort at random times- feels like gas or indigestion. This is mild and not exertional.  Current Outpatient Prescriptions on File Prior to Visit  Medication Sig Dispense Refill  . alfuzosin (UROXATRAL) 10 MG 24 hr tablet Take 10 mg by mouth daily.      Marland Kitchen aspirin EC 81 MG EC tablet Take 1 tablet (81 mg total) by mouth daily.    Marland Kitchen atorvastatin (LIPITOR) 40 MG tablet Take 1 tablet (40 mg total) by mouth daily. 90 tablet 1  . Cyanocobalamin (VITAMIN B 12 PO) Take 1 tablet by mouth 2 (two) times daily.    . IRON PO Take 1 tablet by mouth daily.    . nitroGLYCERIN (NITROSTAT) 0.4 MG SL tablet Place 1 tablet (0.4 mg total) under the tongue every 5 (five) minutes as needed for chest pain (up to 3 doses). WARNING: Do not take this medicine if you have taken Cialis (Tadalafil) within the last 24 hours. 25 tablet 4  . tadalafil (CIALIS) 20 MG tablet Take 1 tablet (20 mg total) by mouth daily as needed. WARNING: Do not take this medicine if you have taken Nitroglycerin within the last 24 hours.    . traMADol (ULTRAM) 50 MG tablet Take 50 mg by mouth every 6 (six) hours as needed for  pain.     . vitamin C (ASCORBIC ACID) 500 MG tablet Take 500 mg by mouth daily.     No current facility-administered medications on file prior to visit.    No Known Allergies  Past Medical History  Diagnosis Date  . BPH (benign prostatic hyperplasia)     s/p TURP  . Arthritis   . Diverticulosis   . Hepatic cyst   . Hepatic hemangioma   . Hemorrhoids   . DJD (degenerative joint disease)   . CAD (coronary artery disease)     a. s/p DES to RCA 08/23/12, residual moderate LAD dz for med rx;   b. ETT-MV 08/2012 to assess LAD:  EF 60%, no ischemia    . Hyperlipidemia   . PAT (paroxysmal atrial tachycardia)     Noted 08/2012 - BB started  . Anemia     Noted 08/2012  . Thrombocytopenia     Noted 08/2012  . Hematuria     Workup 2012 - no recent hematuria, reported cystoscopy last year unremarkable (short urethra, no major bladder lesions)  . DJD (degenerative joint disease)   . Crohn's disease   . C. difficile diarrhea     Past Surgical History  Procedure Laterality Date  . Back surgery      x5  . Cholecystectomy    . Shoulder arthroscopy    .  Transurethral resection of prostate    . Cardiac catheterization      with stent placement  . Left heart catheterization with coronary angiogram N/A 08/22/2012    Procedure: LEFT HEART CATHETERIZATION WITH CORONARY ANGIOGRAM;  Surgeon: Hillary Bow, MD;  Location[facility] C[facility] LAB;  Service: Cardiovascular;  Laterality: N/A;  . Percutaneous coronary stent intervention (pci-s) N/A 08/23/2012    Procedure: PERCUTANEOUS CORONARY STENT INTERVENTION (PCI-S);  Surgeon: Benjamen Koelling M Martinique, MD;  Location[facility] CATH LAB;  Service: Cardiovascular;  Laterality: N/A;    History  Smoking status  . Never Smoker   Smokeless tobacco  . Never Used    History  Alcohol Use  . Yes    Comment: 2 cocktails 2-3 times per week    Family History  Problem Relation Age of Onset  . Lung cancer Mother   . Prostate cancer Father   . Stroke Sister   . Colon  cancer Neg Hx     Review of Systems: As noted in history of present illness. All other systems were reviewed and are negative.  Physical Exam: BP 122/62 mmHg  Pulse 48  Ht 5\' 10"  (1.778 m)  Wt 77.338 kg (170 lb 8 oz)  BMI 24.46 kg/m2 He is a pleasant white male in no acute distress. HEENT: Normal.  Oropharynx is clear. Neck is without JVD, adenopathy, thyromegaly, or bruits. Lungs: Clear Cardiovascular: Regular rate and rhythm, normal S1 and S2, no gallop or murmur. Abdomen: Soft and nontender. No masses or bruits. Extremities: Good pedal pulses. No edema.  Neuro: Alert and oriented x3. Cranial nerves II through XII are intact. LABORATORY DATA: Lab Results  Component Value Date   WBC 7.0 07/11/2013   HGB 15.0 07/11/2013   HCT 43.4 07/11/2013   PLT 162.0 07/11/2013   GLUCOSE 103* 03/16/2014   CHOL 100 03/16/2014   TRIG 53.0 03/16/2014   HDL 42.80 03/16/2014   LDLCALC 47 03/16/2014   ALT 20 03/16/2014   AST 21 03/16/2014   NA 141 03/16/2014   K 4.0 03/16/2014   CL 106 03/16/2014   CREATININE 0.9 03/16/2014   BUN 20 03/16/2014   CO2 30 03/16/2014   TSH 0.791 08/21/2012   INR 1.05 03/17/2013   HGBA1C 5.3 08/21/2012   Ecg shows marked sinus brady with rate 48 bpm, otherwise normal. I have personally reviewed and interpreted this study.   Assessment / Plan: 1. Coronary disease status post stenting of the mid RCA with a drug-eluting stent on 08/23/2012. Moderate to severe disease in distal LAD. Continue ASA and statin therapy. Not a candidate for beta blocker due to bradycardia. He has intermittent atypical chest pain. Will update stress Myoview in August.   2. Hypertension-now well controlled.  3. Hyperlipidemia. Well controlled on Lipitor. We will check fasting lab work today.

## 2015-04-02 ENCOUNTER — Other Ambulatory Visit (INDEPENDENT_AMBULATORY_CARE_PROVIDER_SITE_OTHER): Payer: BLUE CROSS/BLUE SHIELD

## 2015-04-02 ENCOUNTER — Ambulatory Visit (INDEPENDENT_AMBULATORY_CARE_PROVIDER_SITE_OTHER): Payer: BLUE CROSS/BLUE SHIELD | Admitting: Internal Medicine

## 2015-04-02 ENCOUNTER — Encounter: Payer: Self-pay | Admitting: Internal Medicine

## 2015-04-02 VITALS — BP 114/64 | HR 60 | Ht 69.75 in | Wt 175.5 lb

## 2015-04-02 DIAGNOSIS — R161 Splenomegaly, not elsewhere classified: Secondary | ICD-10-CM | POA: Diagnosis not present

## 2015-04-02 DIAGNOSIS — K50919 Crohn's disease, unspecified, with unspecified complications: Secondary | ICD-10-CM

## 2015-04-02 DIAGNOSIS — D696 Thrombocytopenia, unspecified: Secondary | ICD-10-CM

## 2015-04-02 DIAGNOSIS — K5000 Crohn's disease of small intestine without complications: Secondary | ICD-10-CM | POA: Diagnosis not present

## 2015-04-02 LAB — HEPATIC FUNCTION PANEL
ALBUMIN: 4 g/dL (ref 3.5–5.2)
ALT: 20 U/L (ref 0–53)
AST: 21 U/L (ref 0–37)
Alkaline Phosphatase: 83 U/L (ref 39–117)
Bilirubin, Direct: 0.3 mg/dL (ref 0.0–0.3)
Total Bilirubin: 1.2 mg/dL (ref 0.2–1.2)
Total Protein: 5.9 g/dL — ABNORMAL LOW (ref 6.0–8.3)

## 2015-04-02 LAB — FERRITIN: Ferritin: 59.4 ng/mL (ref 22.0–322.0)

## 2015-04-02 LAB — FOLATE: Folate: 16.5 ng/mL (ref >5.9)

## 2015-04-02 LAB — VITAMIN B12: Vitamin B-12: 1339 pg/mL — ABNORMAL HIGH (ref 211–911)

## 2015-04-02 LAB — SEDIMENTATION RATE: Sed Rate: 3 mm/hr (ref 0–22)

## 2015-04-02 NOTE — Progress Notes (Signed)
Chase Barrett Jan 17, 1946 [id number]  Note: This dictation was prepared with Dragon digital system. Any transcriptional errors that result from this procedure are unintentional.   History of Present Illness: This is a 69 year old white male, last appointment April 2015,  diagnosis of  Crohn's ileitis since 2012 ,made on CT scan of the abdomen and confirmed on small bowel capsule endoscopy in April and November 2014. Negative IBD markers. He had an episode of C. difficile colitis treated  in February 2015. Imuran discontinued while patient was treated for skin infection involving  right shin, he never  resumed the Imuran. Last colonoscopy April 2014 showed diverticulosis. Hemoglobin 15.9 hematocrit 45.6 with platelet count of 144,000. Mild elevation of total bilirubin. He has no complaints today. Diarrhea occurs once every 6-8 weeks. His weight has been stable. Denies abdominal pain , he  takes iron supplements every other day and oral B12 on daily basis    Past Medical History  Diagnosis Date  . BPH (benign prostatic hyperplasia)     s/p TURP  . Arthritis   . Diverticulosis   . Hepatic cyst   . Hepatic hemangioma   . Hemorrhoids   . DJD (degenerative joint disease)   . CAD (coronary artery disease)     a. s/p DES to RCA 08/23/12, residual moderate LAD dz for med rx;   b. ETT-MV 08/2012 to assess LAD:  EF 60%, no ischemia    . Hyperlipidemia   . PAT (paroxysmal atrial tachycardia)     Noted 08/2012 - BB started  . Anemia     Noted 08/2012  . Thrombocytopenia     Noted 08/2012  . Hematuria     Workup 2012 - no recent hematuria, reported cystoscopy last year unremarkable (short urethra, no major bladder lesions)  . DJD (degenerative joint disease)   . Crohn's disease   . C. difficile diarrhea     Past Surgical History  Procedure Laterality Date  . Back surgery      x5  . Cholecystectomy    . Shoulder arthroscopy    . Transurethral resection of prostate    . Cardiac  catheterization      with stent placement  . Left heart catheterization with coronary angiogram N/A 08/22/2012    Procedure: LEFT HEART CATHETERIZATION WITH CORONARY ANGIOGRAM;  Surgeon: Hillary Bow, MD;  Location: [facility] CATH LAB;  Service: Cardiovascular;  Laterality: N/A;  . Percutaneous coronary stent intervention (pci-s) N/A 08/23/2012    Procedure: PERCUTANEOUS CORONARY STENT INTERVENTION (PCI-S);  Surgeon: Peter M Martinique, MD;  Location: [facility] CATH LAB;  Service: Cardiovascular;  Laterality: N/A;    No Known Allergies  Family history and social history have been reviewed.  Review of Systems: Weight is stable. Denies nausea vomiting or rectal bleeding  The remainder of the 10 point ROS is negative except as outlined in the H&P  Physical Exam: General Appearance Well developed, in no distress Eyes  Non icteric  HEENT  Non traumatic, normocephalic , telangiectasias of the nose Mouth No lesion, tongue papillated, no cheilosis Neck Supple without adenopathy, thyroid not enlarged, no carotid bruits, no JVD Lungs Clear to auscultation bilaterally COR Normal S1, normal S2, regular rhythm, no murmur, quiet precordium Abdomen soft, nontender. No palpable mass. Normoactive bowel sounds Rectal dark Hemoccult-positive stool Extremities  No pedal edema Skin No lesions, a healed scar right shin Neurological Alert and oriented x 3 Psychological Normal mood and affect  Assessment and Plan:   69 year old white male with  Crohn's disease of the small bowel. Positive small bowel capsule endoscopy ( terminal ileum). He remains asymptomatic on iron and B12 supplements. He is not on Imuran discontinued last year. Because of his thrombocytopenia we will hold off on  Restarting Imuran at this point. He lives in [place] and will have to [place] a gastroenterologist there in order to consider biologicals.He is  Not on any treatment for Crohn's. Mesalamine shown not to be efective  Thrombocytopenia.  Elevated total bilirubin. We will obtain liver function test and fractionate  bilirubin. Also iron levels and B12 levels today we will obtain upper abdominal ultrasound to rule out splenomegaly. He may need to see a hematologist to further evaluate thrombocytopenia    [name] 04/02/2015

## 2015-04-02 NOTE — Patient Instructions (Addendum)
Dr Lennette Bihari Howard,Dr Peter Martinique  Your physician has requested that you go to the basement for lab work before leaving today.  Please take order to your local hospital to have your Ultrasound completed. Have the facility fax results to Dr. Olevia Perches at [phone]

## 2015-04-03 LAB — IRON AND TIBC
%SAT: 30 % (ref 20–55)
Iron: 86 ug/dL (ref 42–165)
TIBC: 285 ug/dL (ref 215–435)
UIBC: 199 ug/dL (ref 125–400)

## 2015-04-26 ENCOUNTER — Telehealth: Payer: Self-pay | Admitting: *Deleted

## 2015-04-26 ENCOUNTER — Other Ambulatory Visit: Payer: Self-pay | Admitting: *Deleted

## 2015-04-26 DIAGNOSIS — R935 Abnormal findings on diagnostic imaging of other abdominal regions, including retroperitoneum: Secondary | ICD-10-CM

## 2015-04-26 DIAGNOSIS — R160 Hepatomegaly, not elsewhere classified: Secondary | ICD-10-CM

## 2015-04-26 NOTE — Telephone Encounter (Signed)
-----   Message from Lafayette Dragon, MD sent at 04/23/2015  5:05 PM EDT ----- Regarding: contrast enhanced MRI Chase Barrett, Chase Barrett'd ultrasound shows liver mass. It was done in [place] ,where he lives. I have the paper report on my desk. They recommend contrast enhanced liver protocol MRI. Can, you, please, schedule it there? He has an appointment with PCP in there next week and the PCP will take over further care. Please call Chase Barrett with the appointment. Thanx DB

## 2015-04-26 NOTE — Telephone Encounter (Signed)
Spoke with scheduling at [facility] and scheduled patient on 05/02/15 at 9:00 AM. NPO after midnight.  Patient aware of appointment. Faxed order to [phone].

## 2015-04-28 ENCOUNTER — Telehealth: Payer: Self-pay | Admitting: Internal Medicine

## 2015-04-28 NOTE — Telephone Encounter (Signed)
Spoke with and let him know a copy was mailed on 04/26/15.

## 2015-04-29 ENCOUNTER — Telehealth (HOSPITAL_COMMUNITY): Payer: Self-pay

## 2015-04-29 NOTE — Telephone Encounter (Signed)
Encounter complete. 

## 2015-05-03 ENCOUNTER — Encounter: Payer: Self-pay | Admitting: *Deleted

## 2015-05-03 ENCOUNTER — Telehealth: Payer: Self-pay | Admitting: *Deleted

## 2015-05-03 NOTE — Telephone Encounter (Signed)
-----   Message from Lafayette Dragon, MD sent at 05/02/2015  8:19 PM EDT ----- Regarding: Chase Barrett, this pt has 2 MRN's.ibuprofen will see him on Tue at 2.30  Or so.He has a stress test by Dr Martinique in am. His MRI at [facility] showed mass in the liver which will require biopsy..  They called Dr Saralyn Pilar over the weekend. I spoke with the pt tonight. He is currently in [place] ,staying  whole week. He wants to have the biopsy  here in [place]. I asked the Radiology tech at [facility] to fax [name] the report. At [phone]. Please call [facility]  Radiology Dept [phone] to request a CD of the MRI of [name], MRI of the liver done on 05/02/2015,MRN # [id number]. It was read by Dr Willow Ora. They may be able to send it directly to [facility] ( they may be able to  E-mail it directly). Thanx DB

## 2015-05-03 NOTE — Telephone Encounter (Signed)
Faxed request

## 2015-05-03 NOTE — Telephone Encounter (Signed)
Spoke with [employer] and need to fax request to [phone]. Called [facility] and was told to have CD sent to radiologty department at [facility] Attention radiology assistants- give to IR physician to review for liver bx.

## 2015-05-04 ENCOUNTER — Telehealth: Payer: Self-pay | Admitting: *Deleted

## 2015-05-04 ENCOUNTER — Encounter: Payer: Self-pay | Admitting: Internal Medicine

## 2015-05-04 ENCOUNTER — Ambulatory Visit (INDEPENDENT_AMBULATORY_CARE_PROVIDER_SITE_OTHER): Payer: Medicare Other | Admitting: Internal Medicine

## 2015-05-04 ENCOUNTER — Ambulatory Visit (HOSPITAL_COMMUNITY)
Admission: RE | Admit: 2015-05-04 | Discharge: 2015-05-04 | Disposition: A | Discharge status: Home / Self Care | Payer: Medicare Other | Source: Ambulatory Visit | Attending: Cardiovascular Disease | Admitting: Cardiovascular Disease

## 2015-05-04 ENCOUNTER — Other Ambulatory Visit (INDEPENDENT_AMBULATORY_CARE_PROVIDER_SITE_OTHER): Payer: Medicare Other

## 2015-05-04 VITALS — BP 118/56 | HR 62 | Ht 71.0 in | Wt 171.6 lb

## 2015-05-04 DIAGNOSIS — I2510 Atherosclerotic heart disease of native coronary artery without angina pectoris: Secondary | ICD-10-CM

## 2015-05-04 DIAGNOSIS — R160 Hepatomegaly, not elsewhere classified: Secondary | ICD-10-CM

## 2015-05-04 DIAGNOSIS — R001 Bradycardia, unspecified: Secondary | ICD-10-CM | POA: Diagnosis present

## 2015-05-04 DIAGNOSIS — E785 Hyperlipidemia, unspecified: Secondary | ICD-10-CM

## 2015-05-04 DIAGNOSIS — R161 Splenomegaly, not elsewhere classified: Secondary | ICD-10-CM

## 2015-05-04 DIAGNOSIS — K50919 Crohn's disease, unspecified, with unspecified complications: Secondary | ICD-10-CM | POA: Diagnosis not present

## 2015-05-04 DIAGNOSIS — D696 Thrombocytopenia, unspecified: Secondary | ICD-10-CM

## 2015-05-04 LAB — MYOCARDIAL PERFUSION IMAGING
CHL CUP NUCLEAR SDS: 0
CHL RATE OF PERCEIVED EXERTION: 15
CSEPHR: 105 %
Estimated workload: 13.4 METS
Exercise duration (min): 11 min
Exercise duration (sec): 30 s
LV dias vol: 127 mL
LV sys vol: 61 mL
MPHR: 151 {beats}/min
Peak HR: 160 {beats}/min
Rest HR: 43 {beats}/min
SRS: 1
TID: 1.18

## 2015-05-04 LAB — HEPATIC FUNCTION PANEL
ALBUMIN: 4.1 g/dL (ref 3.5–5.2)
ALT: 19 U/L (ref 0–53)
AST: 21 U/L (ref 0–37)
Alkaline Phosphatase: 88 U/L (ref 39–117)
Bilirubin, Direct: 0.3 mg/dL (ref 0.0–0.3)
TOTAL PROTEIN: 6.1 g/dL (ref 6.0–8.3)
Total Bilirubin: 1.1 mg/dL (ref 0.2–1.2)

## 2015-05-04 LAB — SEDIMENTATION RATE: SED RATE: 5 mm/h (ref 0–22)

## 2015-05-04 LAB — PROTIME-INR
INR: 1.1 ratio — AB (ref 0.8–1.0)
PROTHROMBIN TIME: 12.5 s (ref 9.6–13.1)

## 2015-05-04 MED ORDER — TECHNETIUM TC 99M SESTAMIBI GENERIC - CARDIOLITE
9.9000 | Freq: Once | INTRAVENOUS | Status: AC | PRN
  Administered 2015-05-04: 10 via INTRAVENOUS

## 2015-05-04 MED ORDER — TECHNETIUM TC 99M SESTAMIBI GENERIC - CARDIOLITE
30.5000 | Freq: Once | INTRAVENOUS | Status: AC | PRN
  Administered 2015-05-04: 31 via INTRAVENOUS

## 2015-05-04 NOTE — Telephone Encounter (Signed)
Spoke to patient. Result given . Verbalized understanding  

## 2015-05-04 NOTE — Progress Notes (Signed)
Chase Barrett 1945-12-22 [id number]  Note: This dictation was prepared with Dragon digital system. Any transcriptional errors that result from this procedure are unintentional.   History of Present Illness: This is a 69 year old white male presents today to discuss results of MRI of the liver done at [facility], [place], 2 days ago and showed an indeterminate mass in the left lobe of the liver measuring 26 mm in diameter ,guided biopsy is recommended. This mass was well visualized on the ultrasound of the liver done prior to MRI. We have requested images of the MRI and there were mailed yesterday to [name] with plans for the patient to have a biopsy of the liver mass in [place]. As of today the images have not  been delivered. Patient is staying in  [place] this week but will be going back to [place]  later this week. It is not clear whether the biopsy will be done in [place] or in his hometown. He has an appointment for the first time  with his new rimary care physician  Dr. Radene [facility]. [address] , [place], [place] ,[place]. Telephone [phone]. Patient denies any abdominal pain. We have been following him for Crohn's disease of the distal small bowel. He drinks alcohol 4 ounces of Scotch  3 times a week. He has splenomegaly and thrombocytopenia    Past Medical History  Diagnosis Date  . BPH (benign prostatic hyperplasia)     s/p TURP  . Arthritis   . Diverticulosis   . Hepatic cyst   . Hepatic hemangioma   . Hemorrhoids   . DJD (degenerative joint disease)   . CAD (coronary artery disease)     a. s/p DES to RCA 08/23/12, residual moderate LAD dz for med rx;   b. ETT-MV 08/2012 to assess LAD:  EF 60%, no ischemia    . Hyperlipidemia   . PAT (paroxysmal atrial tachycardia)     Noted 08/2012 - BB started  . Anemia     Noted 08/2012  . Thrombocytopenia     Noted 08/2012  . Hematuria     Workup 2012 - no recent  hematuria, reported cystoscopy last year unremarkable (short urethra, no major bladder lesions)  . DJD (degenerative joint disease)   . Crohn's disease   . C. difficile diarrhea     Past Surgical History  Procedure Laterality Date  . Back surgery      x5  . Cholecystectomy    . Shoulder arthroscopy    . Transurethral resection of prostate    . Cardiac catheterization      with stent placement  . Left heart catheterization with coronary angiogram N/A 08/22/2012    Procedure: LEFT HEART CATHETERIZATION WITH CORONARY ANGIOGRAM;  Surgeon: Hillary Bow, MD;  Location: [facility] CATH LAB;  Service: Cardiovascular;  Laterality: N/A;  . Percutaneous coronary stent intervention (pci-s) N/A 08/23/2012    Procedure: PERCUTANEOUS CORONARY STENT INTERVENTION (PCI-S);  Surgeon: Peter M Martinique, MD;  Location: [facility] CATH LAB;  Service: Cardiovascular;  Laterality: N/A;  . Achilles tendon surgery Left     No Known Allergies  Family history and social history have been reviewed.  Review of Systems:   The remainder of the 10 point ROS is negative except as outlined in the H&P  Physical Exam: General Appearance Well developed, in no distress Eyes  Non icteric  HEENT  Non traumatic, normocephalic  Mouth No lesion, tongue papillated,  no cheilosis Neck Supple without adenopathy, thyroid not enlarged, no carotid bruits, no JVD Lungs Clear to auscultation bilaterally COR Normal S1, normal S2, regular rhythm, no murmur, quiet precordium Abdomen soft relaxed abdomen with normoactive bowel sounds. Liver edge at costal margin. Splenic tip not palpable. There is no ascites Rectal not done Extremities  No pedal edema Skin No lesions Neurological Alert and oriented x 3 Psychological Normal mood and affect  Assessment and Plan:   [age]-year-old white male with the mass in the medial aspect of the left lobe of the liver which is indeterminate and the biopsy has been recommended. If we receive the CD of the MRI in  next 24 hours, we will have it reviewed by our IR and schedule biopsy appropriately. If we don't receive it  on time patient he will see his Primary care physician in [facility] next week  and have him arrange the biopsy there. Today we will obtain alpha-fetoprotein, CEA level, PT, hepatic function and sedimentation rate  Crohn's disease of the small bowel. Asymptomatic. History of iron deficiency. We'll connect with the new gastroenterologist in his hometown   Chase Barrett 05/04/2015

## 2015-05-04 NOTE — Telephone Encounter (Signed)
Patient will come for OV today at 2:30 PM. Left a message for [facility] radiology to call and confirm receipt of faxed request.

## 2015-05-04 NOTE — Telephone Encounter (Signed)
-----   Message from Peter M Martinique, MD sent at 05/04/2015  4:12 PM EDT ----- Myoview looks very good- no significant ischemia and normal EF. Excellent exercise capacity. Continue medical therapy  Peter Martinique MD, [facility]

## 2015-05-04 NOTE — Patient Instructions (Addendum)
Go to the basement for labs today  DR Sharlotte Alamo, [address] [place], [place]

## 2015-05-05 ENCOUNTER — Telehealth: Payer: Self-pay | Admitting: *Deleted

## 2015-05-05 LAB — CEA: CEA: 0.8 ng/mL (ref 0.0–5.0)

## 2015-05-05 LAB — AFP TUMOR MARKER: AFP TUMOR MARKER: 1.5 ng/mL (ref <6.1)

## 2015-05-05 NOTE — Telephone Encounter (Signed)
-----   Message from Lafayette Dragon, MD sent at 05/05/2015  9:30 AM EDT ----- Regarding: fax records Rollene Fare, I spoke with Mr Blumenberg's PCP  Where he has an appointmenr next Mon  Aug 22. Please fax them my last 2 office notes from 03/2015 and from yesterday. FAX [phone]

## 2015-05-05 NOTE — Telephone Encounter (Signed)
Records faxed as requested.

## 2015-05-31 ENCOUNTER — Other Ambulatory Visit: Payer: Self-pay | Admitting: Cardiology

## 2015-05-31 MED ORDER — ATORVASTATIN CALCIUM 40 MG PO TABS: 40.0000 mg | ORAL_TABLET | Freq: Every day | ORAL | Status: DC

## 2015-12-03 ENCOUNTER — Telehealth: Payer: Self-pay | Admitting: Cardiology

## 2015-12-03 MED ORDER — ATORVASTATIN CALCIUM 40 MG PO TABS: 40.0000 mg | ORAL_TABLET | Freq: Every day | ORAL | Status: DC

## 2015-12-03 NOTE — Telephone Encounter (Signed)
Refill sent.

## 2015-12-03 NOTE — Telephone Encounter (Signed)
°*  STAT* If patient is at the pharmacy, call can be transferred to refill team.   1. Which medications need to be refilled? (please list name of each medication and dose if known) Atorvastatin-new prescription 2. Which pharmacy/location (including street and city if local pharmacy) is medication to be sent to?[phone]  3. Do they need a 30 day or 90 day supply? 90 and refills

## 2016-01-04 ENCOUNTER — Other Ambulatory Visit: Payer: Self-pay | Admitting: Dermatology

## 2016-01-04 DIAGNOSIS — C439 Malignant melanoma of skin, unspecified: Secondary | ICD-10-CM

## 2016-01-04 HISTORY — DX: Malignant melanoma of skin, unspecified: C43.9

## 2016-01-04 IMAGING — NM NM MISC PROCEDURE
6 series · 36 of 36 positions shown · non-contrast
Comparison: none

[Series 1: wbr_r-proj_st wbr rest · 6.40mm/px · 6 of 64 frames shown]
[frame 6/64]
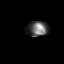
[frame 16/64]
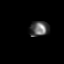
[frame 27/64]
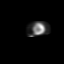
[frame 38/64]
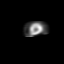
[frame 48/64]
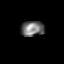
[frame 59/64]
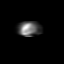

[Series 1: wbr rest · 6.40mm/px · 6 of 64 frames shown]
[frame 6/64]
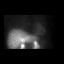
[frame 16/64]
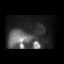
[frame 27/64]
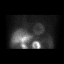
[frame 38/64]
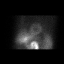
[frame 48/64]
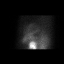
[frame 59/64]
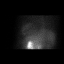

[Series 2: wbr stress-gsp · 6.40mm/px · 6 of 510 frames shown]
[frame 43/510  full-range]
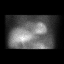
[frame 128/510  full-range]
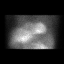
[frame 213/510  full-range]
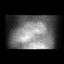
[frame 298/510  full-range]
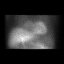
[frame 383/510  full-range]
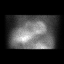
[frame 468/510  full-range]
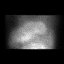

[Series 2: wbr_s-proj_st wbr stress-gsp · 6.40mm/px · 6 of 506 frames shown]
[frame 43/506]
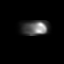
[frame 127/506]
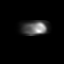
[frame 211/506]
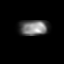
[frame 296/506]
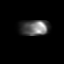
[frame 380/506]
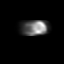
[frame 464/506]
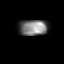

[Series 3: wbr_s-proj_st wbr stress-sum-em · 6.40mm/px · 6 of 62 frames shown]
[frame 6/62]
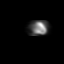
[frame 16/62]
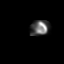
[frame 26/62]
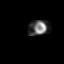
[frame 37/62]
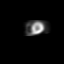
[frame 47/62]
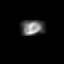
[frame 57/62]
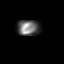

[Series 3: wbr stress-sum-em · 6.40mm/px · 6 of 64 frames shown]
[frame 6/64]
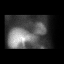
[frame 16/64]
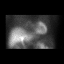
[frame 27/64]
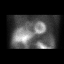
[frame 38/64]
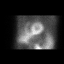
[frame 48/64]
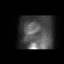
[frame 59/64]
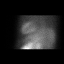

[36 of 36 positions shown; findings below may reference images not displayed]

Canned report from images found in remote index.

Refer to host system for actual result text.

## 2016-02-15 ENCOUNTER — Other Ambulatory Visit: Payer: Self-pay | Admitting: Dermatology

## 2016-03-09 ENCOUNTER — Other Ambulatory Visit: Payer: Self-pay | Admitting: Dermatology

## 2016-06-03 ENCOUNTER — Other Ambulatory Visit: Payer: Self-pay | Admitting: Cardiology

## 2016-06-05 NOTE — Telephone Encounter (Signed)
Rx(s) sent to pharmacy electronically.  

## 2016-07-03 ENCOUNTER — Other Ambulatory Visit: Payer: Self-pay | Admitting: Cardiology

## 2016-07-05 ENCOUNTER — Other Ambulatory Visit: Payer: Self-pay | Admitting: Dermatology

## 2016-08-05 ENCOUNTER — Other Ambulatory Visit: Payer: Self-pay | Admitting: Cardiology

## 2016-09-15 ENCOUNTER — Ambulatory Visit: Payer: Medicare Other | Admitting: Cardiology

## 2019-04-10 ENCOUNTER — Encounter: Payer: Self-pay | Admitting: *Deleted
